# Patient Record
Sex: Female | Born: 1985
Health system: Southern US, Community
[De-identification: ages and names within clinical notes are randomized; demographics above are authoritative.]

## PROBLEM LIST (undated history)

## (undated) DIAGNOSIS — C439 Malignant melanoma of skin, unspecified: Secondary | ICD-10-CM

## (undated) DIAGNOSIS — N3010 Interstitial cystitis (chronic) without hematuria: Secondary | ICD-10-CM

## (undated) DIAGNOSIS — F419 Anxiety disorder, unspecified: Secondary | ICD-10-CM

## (undated) HISTORY — PX: TONGUE SURGERY: SHX810

---

## 2004-06-15 ENCOUNTER — Emergency Department (HOSPITAL_COMMUNITY): Admission: EM | Admit: 2004-06-15 | Discharge: 2004-06-15 | Payer: Self-pay | Admitting: Emergency Medicine

## 2005-03-24 ENCOUNTER — Ambulatory Visit (HOSPITAL_COMMUNITY): Admission: RE | Admit: 2005-03-24 | Discharge: 2005-03-24 | Payer: Self-pay | Admitting: Family Medicine

## 2006-01-29 ENCOUNTER — Observation Stay (HOSPITAL_COMMUNITY): Admission: AD | Admit: 2006-01-29 | Discharge: 2006-01-30 | Payer: Self-pay | Admitting: Obstetrics and Gynecology

## 2006-02-18 ENCOUNTER — Ambulatory Visit (HOSPITAL_COMMUNITY): Admission: AD | Admit: 2006-02-18 | Discharge: 2006-02-18 | Payer: Self-pay | Admitting: Obstetrics and Gynecology

## 2006-02-23 ENCOUNTER — Ambulatory Visit (HOSPITAL_COMMUNITY): Admission: AD | Admit: 2006-02-23 | Discharge: 2006-02-23 | Payer: Self-pay | Admitting: Obstetrics and Gynecology

## 2006-02-25 ENCOUNTER — Inpatient Hospital Stay (HOSPITAL_COMMUNITY): Admission: AD | Admit: 2006-02-25 | Discharge: 2006-02-28 | Payer: Self-pay | Admitting: Obstetrics and Gynecology

## 2007-09-23 ENCOUNTER — Ambulatory Visit (HOSPITAL_COMMUNITY): Admission: RE | Admit: 2007-09-23 | Discharge: 2007-09-23 | Payer: Self-pay | Admitting: Family Medicine

## 2007-12-25 ENCOUNTER — Emergency Department (HOSPITAL_COMMUNITY): Admission: EM | Admit: 2007-12-25 | Discharge: 2007-12-25 | Payer: Self-pay | Admitting: Emergency Medicine

## 2008-03-01 ENCOUNTER — Other Ambulatory Visit: Admission: RE | Admit: 2008-03-01 | Discharge: 2008-03-01 | Payer: Self-pay | Admitting: Obstetrics & Gynecology

## 2011-03-07 NOTE — Group Therapy Note (Signed)
NAMENASHALI, DITMER NO.:  [id number]   MEDICAL RECORD NO.:  [id number]          PATIENT TYPE:  INP   LOCATION:  A402                          FACILITY:  [facility]   PHYSICIAN:  Tilda Burrow, M.D. DATE OF BIRTH:  Feb 25, 1986   DATE OF PROCEDURE:  DATE OF DISCHARGE:                                   PROGRESS NOTE   DELIVERY NOTE  Cailin received an epidural and was extremely comfortable and progressed  nicely through labor.  I examined her just as a routine check and noticed  the baby to be at +3 to +4 station.  She had no sense of __pressure__ at  all.  We got her in a pushing position, and she pushed for about 15 minutes  and had a spontaneous vaginal delivery of a viable female infant at 1301  hours.  The mouth and nose were suctioned and the body delivered without any  difficulty at all.  Weight is 6 pounds 3 ounces.  Apgars are 9 and 9.  Pitocin 20 units diluted in 1000 mL of lactated Ringer's was then infused  rapidly IV.  The placenta separated spontaneously and delivered via  controlled cord traction at 1305 hours.  It was inspected and appears to be  intact with a three-vessel cord.  The fundus was immediately firm and barely  any blood loss was noted at all.  Estimated blood loss is 100 mL.  The  vagina was inspected, and a first-degree perineal laceration was noted.  I  did repair it with about three stitches of 2-0 Vicryl under epidural  anesthesia.  Epidural was then removed, and the blue tip was visualized as  being intact.      Jacklyn Shell, C.N.M.      Tilda Burrow, M.D.  Electronically Signed    FC/MEDQ  D:  02/26/2006  T:  02/27/2006  Job:  [id number]   cc:   Francoise Schaumann. Milford Cage DO, FAAP  Fax: [phone]

## 2011-03-07 NOTE — H&P (Signed)
Sheri Wyatt, Sheri Wyatt NO.:  [id number]   MEDICAL RECORD NO.:  [id number]          PATIENT TYPE:  OIB   LOCATION:  A415                          FACILITY:  [facility]   PHYSICIAN:  [name], M.D.   DATE OF BIRTH:  06/06/1986   DATE OF ADMISSION:  02/23/2006  DATE OF DISCHARGE:  LH                                HISTORY & PHYSICAL   Sheri Wyatt is a 25 year old white female, gravida 1, para 0.  Estimated date of  delivery Mar 10, 2006 at 38 weeks and a couple of days gestation, who has  pure gestational hypertension.  She has had a negative workup for  preeclampsia and continues to have negative protein but blood pressures  remained in the 140/100 to 130/100 range.  She has had reassuring fetal  heart rate tracings.  Her cervix is tight fingertip, soft, posterior vertex,  -2 station.  She is admitted for Foley bulb ripening followed by Pitocin  induction.   PAST MEDICAL HISTORY:  Significant for interstitial cystitis, seasonal  allergies, asthma, and HSV-II.   PAST SURGICAL HISTORY:  Wisdom teeth and filtrum clip.   ALLERGIES:  None.   MEDICATIONS:  Currently are Valtrex, prenatal vitamins, and allergy meds.   REVIEW OF SYSTEMS:  Otherwise negative.  Blood type is O-.  Rubella is  immune.  Group B strep is negative.  GC and Chlamydia are negative x2.  She  is on Valtrex suppression.   IMPRESSION:  1.  Intrauterine pregnancy at 38+ weeks gestation.  2.  Pregnancy induced hypertension, pure gestational hypertension.  No      evidence of preeclampsia.   PLAN:  Patient admitted for Foley bulb ripening and Pitocin induction.      [name], M.D.  Electronically Signed     LHE/MEDQ  D:  02/23/2006  T:  02/23/2006  Job:  [id number]

## 2011-03-07 NOTE — H&P (Signed)
NAMEKIAIRA, POINTER NO.:  [id number]   MEDICAL RECORD NO.:  [id number]           PATIENT TYPE:  OIB   LOCATION:  LDR3                          FACILITY:  [facility]   PHYSICIAN:  [name], M.D.   DATE OF BIRTH:  Mar 12, 1986   DATE OF ADMISSION:  DATE OF DISCHARGE:  LH                                HISTORY & PHYSICAL   Patient is going to be observation in the birthing center.   CHIEF COMPLAINT:  Elevated blood pressures at [redacted] weeks gestation.   Skilar came into see [place] on Tuesday for a regular prenatal visit, where her  diastolic was noted to be elevated at 90, where typically she had been in  the 60s to 80 range.  She did not have proteinuria.  DTRs were 4+ at that  time, and she did have some edema.  The edema is nothing new.  She was asked  to come back in two days for re-evaluation.   Her blood pressure today was 160/100.  Still no proteinuria.  Still 2-3+  edema.  Still 4+ DTRs without clonus.  She does complain of a headache  without visual disturbances.   We are going to send her over to labor and delivery to run some preeclampsia  labs, 24-hour urine, and get some serial blood pressures on her so we can  see if this is going to become a problem or not.      Jacklyn Shell, C.N.M.      [name], M.D.  Electronically Signed    FC/MEDQ  D:  01/29/2006  T:  01/29/2006  Job:  [id number]

## 2011-03-07 NOTE — Op Note (Signed)
NAMEJOANA, NOLTON NO.:  [id number]   MEDICAL RECORD NO.:  [id number]          PATIENT TYPE:  INP   LOCATION:  A402                          FACILITY:  [facility]   PHYSICIAN:  [name], M.D.   DATE OF BIRTH:  04-20-1986   DATE OF PROCEDURE:  02/26/2006  DATE OF DISCHARGE:  02/28/2006                                 OPERATIVE REPORT   PROCEDURE:  Epidural.   Sheri Wyatt is a 25 year old, nulliparous female, in active phase of labor,  requesting epidural be placed.  She is placed in sitting position.  Betadine  prep was used and 1% lidocaine was injected into the L3-L4 interspace areas.  The 17 gauge Tuohy needle was used, loss of resistance technique employed.  The epidural space was found on one pass without difficulty and 10 cc of  0.125% bupivacaine plain is given into the epidural space without  difficulty.  The epidural catheter is then fed 5 cm into the space.  An  additional 10 cc is given to dose up the epidural.  The continuous infusion  of 0.125% bupivacaine with 2 micrograms per cc of fentanyl was then begun at  12 cc an hour.  It was taped down.  The patient tolerated the procedure  well.  She is having no drop in blood pressure.  Fetal heart rate tracing is  stable and she is getting good pain relief.      [name], M.D.  Electronically Signed     LHE/MEDQ  D:  04/20/2006  T:  04/20/2006  Job:  [id number]

## 2011-07-14 LAB — BASIC METABOLIC PANEL
BUN: 15
CO2: 24
Chloride: 107
Creatinine, Ser: 0.83
Glucose, Bld: 127 — ABNORMAL HIGH
Sodium: 139

## 2011-07-14 LAB — URINALYSIS, ROUTINE W REFLEX MICROSCOPIC
Glucose, UA: NEGATIVE
Nitrite: NEGATIVE
Protein, ur: NEGATIVE
Urobilinogen, UA: 0.2
pH: 5.5

## 2011-07-14 LAB — CBC: MCHC: 34.3

## 2011-07-14 LAB — DIFFERENTIAL
Basophils Relative: 0
Eosinophils Absolute: 0.1
Eosinophils Relative: 0
Lymphocytes Relative: 5 — ABNORMAL LOW
Monocytes Absolute: 0.6
Neutro Abs: 15.5 — ABNORMAL HIGH

## 2011-07-14 LAB — URINE MICROSCOPIC-ADD ON

## 2011-12-29 ENCOUNTER — Emergency Department (HOSPITAL_COMMUNITY)
Admission: EM | Admit: 2011-12-29 | Discharge: 2011-12-29 | Disposition: A | Discharge status: Home / Self Care | Payer: PRIVATE HEALTH INSURANCE | Attending: Emergency Medicine | Admitting: Emergency Medicine

## 2011-12-29 ENCOUNTER — Emergency Department (HOSPITAL_COMMUNITY): Payer: PRIVATE HEALTH INSURANCE

## 2011-12-29 ENCOUNTER — Encounter (HOSPITAL_COMMUNITY): Payer: Self-pay | Admitting: *Deleted

## 2011-12-29 DIAGNOSIS — M549 Dorsalgia, unspecified: Secondary | ICD-10-CM | POA: Insufficient documentation

## 2011-12-29 DIAGNOSIS — R1084 Generalized abdominal pain: Secondary | ICD-10-CM | POA: Insufficient documentation

## 2011-12-29 DIAGNOSIS — R630 Anorexia: Secondary | ICD-10-CM | POA: Insufficient documentation

## 2011-12-29 DIAGNOSIS — K5900 Constipation, unspecified: Secondary | ICD-10-CM | POA: Insufficient documentation

## 2011-12-29 DIAGNOSIS — R319 Hematuria, unspecified: Secondary | ICD-10-CM | POA: Insufficient documentation

## 2011-12-29 DIAGNOSIS — R109 Unspecified abdominal pain: Secondary | ICD-10-CM

## 2011-12-29 DIAGNOSIS — J45909 Unspecified asthma, uncomplicated: Secondary | ICD-10-CM | POA: Insufficient documentation

## 2011-12-29 DIAGNOSIS — R10817 Generalized abdominal tenderness: Secondary | ICD-10-CM | POA: Insufficient documentation

## 2011-12-29 DIAGNOSIS — R509 Fever, unspecified: Secondary | ICD-10-CM | POA: Insufficient documentation

## 2011-12-29 DIAGNOSIS — R300 Dysuria: Secondary | ICD-10-CM | POA: Insufficient documentation

## 2011-12-29 HISTORY — DX: Interstitial cystitis (chronic) without hematuria: N30.10

## 2011-12-29 HISTORY — DX: Malignant melanoma of skin, unspecified: C43.9

## 2011-12-29 LAB — BASIC METABOLIC PANEL
Calcium: 9.2 mg/dL (ref 8.4–10.5)
Chloride: 101 mEq/L (ref 96–112)
GFR calc Af Amer: >90 mL/min (ref >90)
GFR calc non Af Amer: >90 mL/min (ref >90)
Glucose, Bld: 97 mg/dL (ref 70–99)

## 2011-12-29 LAB — DIFFERENTIAL
Lymphocytes Relative: 9 % — ABNORMAL LOW (ref 12–46)
Monocytes Absolute: 0.3 10*3/uL (ref 0.1–1.0)

## 2011-12-29 LAB — CBC
Hemoglobin: 13.9 g/dL (ref 12.0–15.0)
MCH: 29.1 pg (ref 26.0–34.0)
MCHC: 35.1 g/dL (ref 30.0–36.0)
MCV: 82.8 fL (ref 78.0–100.0)
RBC: 4.78 MIL/uL (ref 3.87–5.11)
RDW: 12.2 % (ref 11.5–15.5)

## 2011-12-29 LAB — POCT PREGNANCY, URINE: Preg Test, Ur: NEGATIVE

## 2011-12-29 MED ORDER — HYDROMORPHONE HCL PF 1 MG/ML IJ SOLN
1.0000 mg | Freq: Once | INTRAMUSCULAR | Status: AC
  Administered 2011-12-29: 1 mg via INTRAVENOUS
  Filled 2011-12-29: qty 1

## 2011-12-29 MED ORDER — KETOROLAC TROMETHAMINE 30 MG/ML IJ SOLN
30.0000 mg | Freq: Once | INTRAMUSCULAR | Status: DC
  Filled 2011-12-29: qty 1

## 2011-12-29 MED ORDER — KETOROLAC TROMETHAMINE 30 MG/ML IJ SOLN
30.0000 mg | Freq: Once | INTRAMUSCULAR | Status: AC
  Administered 2011-12-29: 30 mg via INTRAMUSCULAR

## 2011-12-29 MED ORDER — FENTANYL CITRATE 0.05 MG/ML IJ SOLN
100.0000 ug | Freq: Once | INTRAMUSCULAR | Status: DC
  Filled 2011-12-29: qty 2

## 2011-12-29 MED ORDER — POLYETHYLENE GLYCOL 3350 17 GM/SCOOP PO POWD: 17.0000 g | Freq: Two times a day (BID) | ORAL | Status: AC

## 2011-12-29 MED ORDER — ONDANSETRON HCL 4 MG/2ML IJ SOLN
4.0000 mg | Freq: Once | INTRAMUSCULAR | Status: DC
  Filled 2011-12-29: qty 2

## 2011-12-29 MED ORDER — FENTANYL CITRATE 0.05 MG/ML IJ SOLN
100.0000 ug | Freq: Once | INTRAMUSCULAR | Status: AC
  Administered 2011-12-29: 100 ug via INTRAVENOUS
  Filled 2011-12-29: qty 2

## 2011-12-29 MED ORDER — ONDANSETRON 4 MG PO TBDP
4.0000 mg | ORAL_TABLET | Freq: Once | ORAL | Status: AC
  Administered 2011-12-29: 4 mg via ORAL
  Filled 2011-12-29: qty 1

## 2011-12-29 MED ORDER — OXYCODONE-ACETAMINOPHEN 5-325 MG PO TABS: 1.0000 | ORAL_TABLET | ORAL | Status: AC | PRN

## 2011-12-29 MED ORDER — FENTANYL CITRATE 0.05 MG/ML IJ SOLN
100.0000 ug | Freq: Once | INTRAMUSCULAR | Status: AC
  Administered 2011-12-29: 100 ug via INTRAMUSCULAR
  Filled 2011-12-29: qty 2

## 2011-12-29 NOTE — ED Notes (Signed)
abd and back pain, nausea, without vomiting, no diarrhea.

## 2011-12-29 NOTE — ED Provider Notes (Signed)
History   This chart was scribed for Nelia Shi, MD by Sofie Rower. The patient was seen in room APA07/APA07 and the patient's care was started at 3:15PM.    CSN: [id number]  Arrival date & time 12/29/11  1353   First MD Initiated Contact with Patient 12/29/11 1512      Chief Complaint  Patient presents with  . Abdominal Pain    (Consider location/radiation/quality/duration/timing/severity/associated sxs/prior treatment) HPI  Sheri Wyatt is a 26 y.o. female who presents to the Emergency Department complaining of severe, constant abdominal pain generalized throughout all four quadrants, onset two days ago with associated symptoms of dysuria, hematuria, fever (100), nausea, loss of appetite, decreased bowel movements. Pt states "it hurts to urinate." Pt also states she "has back pain all across the bottom of her back." Pt describes the pain as a "sharp pain". Pt states "she does not think she is pregnant but there is always a chance." Pt has a hx of interstitial cystitis, allergies to certain foods and cats.   Pt denies abd pain in the past, vomiting.   PCP is Dr. Regino Schultze.  Past Medical History  Diagnosis Date  . Interstitial cystitis   . Asthma   . Melanoma     History reviewed. No pertinent past surgical history.  History reviewed. No pertinent family history.  History  Substance Use Topics  . Smoking status: Never Smoker   . Smokeless tobacco: Not on file  . Alcohol Use: No    OB History    Grav Para Term Preterm Abortions TAB SAB Ect Mult Living                  Review of Systems  All other systems reviewed and are negative.    10 Systems reviewed and are negative for acute change except as noted in the HPI.   Allergies  Review of patient's allergies indicates no known allergies.  Home Medications   Current Outpatient Rx  Name Route Sig Dispense Refill  . OXYCODONE-ACETAMINOPHEN 5-325 MG PO TABS Oral Take 1 tablet by mouth every 4 (four) hours  as needed for pain. 6 tablet 0  . POLYETHYLENE GLYCOL 3350 PO POWD Oral Take 17 g by mouth 2 (two) times daily. 255 g 0    BP 101/86  Pulse 81  Temp(Src) 97.4 F (36.3 C) (Oral)  Resp 18  Ht 5\' 3"  (1.6 m)  Wt 130 lb (58.968 kg)  BMI 23.03 kg/m2  SpO2 100%  LMP 12/27/2011  Physical Exam  Nursing note and vitals reviewed. Constitutional: She is oriented to person, place, and time. She appears well-developed and well-nourished. No distress.  HENT:  Head: Normocephalic and atraumatic.  Right Ear: External ear normal.  Left Ear: External ear normal.  Nose: Nose normal.  Eyes: EOM are normal. Pupils are equal, round, and reactive to light.  Neck: Normal range of motion.  Cardiovascular: Normal rate, regular rhythm, normal heart sounds and intact distal pulses.   Pulmonary/Chest: Effort normal and breath sounds normal. No respiratory distress.  Abdominal: Normal appearance and bowel sounds are normal. She exhibits no distension. There is tenderness (Generalized in all four quadrants.). There is no guarding.  Musculoskeletal: Normal range of motion. She exhibits no edema.  Neurological: She is alert and oriented to person, place, and time. No cranial nerve deficit.  Skin: Skin is warm and dry. No rash noted.  Psychiatric: She has a normal mood and affect. Her behavior is normal.    ED  Course  Procedures (including critical care time)  DIAGNOSTIC STUDIES: Oxygen Saturation is 100% on room air, normal by my interpretation.    COORDINATION OF CARE:     Labs Reviewed  DIFFERENTIAL - Abnormal; Notable for the following:    Neutrophils Relative 85 (*)    Lymphocytes Relative 9 (*)    Lymphs Abs 0.5 (*)    All other components within normal limits  BASIC METABOLIC PANEL - Abnormal; Notable for the following:    Sodium 134 (*)    All other components within normal limits  CBC  POCT PREGNANCY, URINE   Ct Abdomen Pelvis Wo Contrast  12/29/2011  *RADIOLOGY REPORT*  Clinical  Data: Generalized abdominal pain  CT ABDOMEN AND PELVIS WITHOUT CONTRAST  Technique:  Multidetector CT imaging of the abdomen and pelvis was performed following the standard protocol without intravenous contrast.  Comparison: None.  Findings: Clear lung bases.  Normal heart size.  No pericardial or pleural effusion.  Abdomen:  Symmetric kidneys without hydronephrosis or obstruction. No obstructing urinary tract calculus or ureteral calculus demonstrated.  Kidneys are symmetric in size and position.  Liver, gallbladder, biliary system, pancreas, spleen, and adrenal glands are within normal limits for noncontrast study.  Diffuse fluid distention of the distal small bowel and colon. Scattered air fluid levels noted.  No definite obstruction pattern noted.  Retained stool in the sigmoid and rectum.  Appearance suggests some degree of distal fecal impaction.  Hyperdense ingested material or pill fragments noted in the fluid distended cecum, image 63.  Portions of the appendix are demonstrated and unremarkable, image 49.  No acute osseous finding  Pelvis:  No distal pelvic dilated ureter.  Pelvic calcifications appear to be outside the urinary tract consistent with venous phleboliths.  No pelvic free fluid, fluid collection, hemorrhage, hematoma, adenopathy, inguinal abnormality, or hernia.  IMPRESSION: Fluid distended distal small bowel and colon with scattered air fluid levels.  Retained stool in the sigmoid and rectum compatible with some degree of fecal impaction.  No definite obstruction or free air.  No acute obstructing urinary tract calculus.  Negative for acute appendicitis  Original Report Authenticated By: Judie Petit. Ruel Favors, M.D.    Ct Abdomen Pelvis Wo Contrast  12/29/2011  *RADIOLOGY REPORT*  Clinical Data: Generalized abdominal pain  CT ABDOMEN AND PELVIS WITHOUT CONTRAST  Technique:  Multidetector CT imaging of the abdomen and pelvis was performed following the standard protocol without intravenous  contrast.  Comparison: None.  Findings: Clear lung bases.  Normal heart size.  No pericardial or pleural effusion.  Abdomen:  Symmetric kidneys without hydronephrosis or obstruction. No obstructing urinary tract calculus or ureteral calculus demonstrated.  Kidneys are symmetric in size and position.  Liver, gallbladder, biliary system, pancreas, spleen, and adrenal glands are within normal limits for noncontrast study.  Diffuse fluid distention of the distal small bowel and colon. Scattered air fluid levels noted.  No definite obstruction pattern noted.  Retained stool in the sigmoid and rectum.  Appearance suggests some degree of distal fecal impaction.  Hyperdense ingested material or pill fragments noted in the fluid distended cecum, image 63.  Portions of the appendix are demonstrated and unremarkable, image 49.  No acute osseous finding  Pelvis:  No distal pelvic dilated ureter.  Pelvic calcifications appear to be outside the urinary tract consistent with venous phleboliths.  No pelvic free fluid, fluid collection, hemorrhage, hematoma, adenopathy, inguinal abnormality, or hernia.  IMPRESSION: Fluid distended distal small bowel and colon with scattered air fluid levels.  Retained stool in the sigmoid and rectum compatible with some degree of fecal impaction.  No definite obstruction or free air.  No acute obstructing urinary tract calculus.  Negative for acute appendicitis  Original Report Authenticated By: Judie Petit. Ruel Favors, M.D.      1. Abdominal  pain, other specified site   2. Constipation     3:20PM-EDP at bedside discusses treatment plan concerning pain management, Cat scan.   MDM   Scheduled Meds:   . fentaNYL  100 mcg Intramuscular Once  . fentaNYL  100 mcg Intravenous Once  .  HYDROmorphone (DILAUDID) injection  1 mg Intravenous Once  . ketorolac  30 mg Intramuscular Once  . ondansetron  4 mg Oral Once  . DISCONTD: fentaNYL  100 mcg Intravenous Once  . DISCONTD: ketorolac  30 mg  Intravenous Once  . DISCONTD: ondansetron  4 mg Intravenous Once   Continuous Infusions:  PRN Meds:.      I personally performed the services described in this documentation, which was scribed in my presence. The recorded information has been reviewed and considered.    Nelia Shi, MD 12/29/11 [phone]

## 2011-12-29 NOTE — ED Notes (Signed)
Reports pain has gotten worse since receiving IM meds.  edp notified.

## 2011-12-29 NOTE — ED Notes (Signed)
Attempted IV  X 2 without success.  Pt requesting meds be IM or PO.  edp notified.

## 2011-12-29 NOTE — Discharge Instructions (Signed)
Abdominal Pain  Abdominal pain can be caused by many things. Your caregiver decides the seriousness of your pain by an examination and possibly blood tests and X-rays. Many cases can be observed and treated at home. Most abdominal pain is not caused by a disease and will probably improve without treatment. However, in many cases, more time must pass before a clear cause of the pain can be found. Before that point, it may not be known if you need more testing, or if hospitalization or surgery is needed.  HOME CARE INSTRUCTIONS    Do not take laxatives unless directed by your caregiver.   Take pain medicine only as directed by your caregiver.   Only take over-the-counter or prescription medicines for pain, discomfort, or fever as directed by your caregiver.   Try a clear liquid diet (broth, tea, or water) for as long as directed by your caregiver. Slowly move to a bland diet as tolerated.  SEEK IMMEDIATE MEDICAL CARE IF:    The pain does not go away.   You have a fever.   You keep throwing up (vomiting).   The pain is felt only in portions of the abdomen. Pain in the right side could possibly be appendicitis. In an adult, pain in the left lower portion of the abdomen could be colitis or diverticulitis.   You pass bloody or black tarry stools.  MAKE SURE YOU:    Understand these instructions.   Will watch your condition.   Will get help right away if you are not doing well or get worse.  Document Released: 07/16/2005 Document Revised: 09/25/2011 Document Reviewed: 05/24/2008  [employer] Patient Information 2012 [employer]  Constipation in Adults  Constipation is having fewer than 2 bowel movements per week. Usually, the stools are hard. As we grow older, constipation is more common. If you try to fix constipation with laxatives, the problem may get worse. This is because laxatives taken over a long period of time make the colon muscles weaker. A low-fiber diet, not taking in enough fluids, and taking  some medicines may make these problems worse.  MEDICATIONS THAT MAY CAUSE CONSTIPATION   Water pills (diuretics).   Calcium channel blockers (used to control blood pressure and for the heart).   Certain pain medicines (narcotics).   Anticholinergics.   Anti-inflammatory agents.   Antacids that contain aluminum.  DISEASES THAT CONTRIBUTE TO CONSTIPATION   Diabetes.   Parkinson's disease.   Dementia.   Stroke.   Depression.   Illnesses that cause problems with salt and water metabolism.  HOME CARE INSTRUCTIONS    Constipation is usually best cared for without medicines. Increasing dietary fiber and eating more fruits and vegetables is the best way to manage constipation.   Slowly increase fiber intake to 25 to 38 grams per day. Whole grains, fruits, vegetables, and legumes are good sources of fiber. A dietitian can further help you incorporate high-fiber foods into your diet.   Drink enough water and fluids to keep your urine clear or pale yellow.   A fiber supplement may be added to your diet if you cannot get enough fiber from foods.   Increasing your activities also helps improve regularity.   Suppositories, as suggested by your caregiver, will also help. If you are using antacids, such as aluminum or calcium containing products, it will be helpful to switch to products containing magnesium if your caregiver says it is okay.   If you have been given a liquid injection (enema) today,   this is only a temporary measure. It should not be relied on for treatment of longstanding (chronic) constipation.   Stronger measures, such as magnesium sulfate, should be avoided if possible. This may cause uncontrollable diarrhea. Using magnesium sulfate may not allow you time to make it to the bathroom.  SEEK IMMEDIATE MEDICAL CARE IF:    There is bright red blood in the stool.   The constipation stays for more than 4 days.   There is belly (abdominal) or rectal pain.   You do not seem to be getting  better.   You have any questions or concerns.  MAKE SURE YOU:    Understand these instructions.   Will watch your condition.   Will get help right away if you are not doing well or get worse.  Document Released: 07/04/2004 Document Revised: 09/25/2011 Document Reviewed: 09/09/2011  [employer] Patient Information 2012 [employer]

## 2011-12-29 NOTE — ED Notes (Signed)
edp notified of pt's bp and states is okay to d/c pt.

## 2012-01-21 ENCOUNTER — Other Ambulatory Visit (HOSPITAL_COMMUNITY)
Admission: RE | Admit: 2012-01-21 | Discharge: 2012-01-21 | Disposition: A | Discharge status: Home / Self Care | Payer: PRIVATE HEALTH INSURANCE | Source: Ambulatory Visit | Attending: Obstetrics & Gynecology | Admitting: Obstetrics & Gynecology

## 2012-01-21 ENCOUNTER — Other Ambulatory Visit: Payer: Self-pay | Admitting: Obstetrics & Gynecology

## 2012-01-21 DIAGNOSIS — Z01419 Encounter for gynecological examination (general) (routine) without abnormal findings: Secondary | ICD-10-CM | POA: Insufficient documentation

## 2012-03-28 ENCOUNTER — Other Ambulatory Visit: Payer: Self-pay | Admitting: Obstetrics & Gynecology

## 2012-03-28 MED ORDER — KETOROLAC TROMETHAMINE 30 MG/ML IJ SOLN: 30.0000 mg | Freq: Once | INTRAMUSCULAR | Status: DC

## 2012-04-07 ENCOUNTER — Other Ambulatory Visit (HOSPITAL_COMMUNITY): Payer: PRIVATE HEALTH INSURANCE

## 2012-04-14 ENCOUNTER — Ambulatory Visit: Admit: 2012-04-14 | Payer: Self-pay | Admitting: Obstetrics & Gynecology

## 2012-04-14 SURGERY — HYSTERECTOMY, SUPRACERVICAL, LAPAROSCOPIC
Anesthesia: General | Site: Vagina

## 2012-04-15 ENCOUNTER — Encounter (HOSPITAL_COMMUNITY): Payer: Self-pay | Admitting: Pharmacy Technician

## 2012-04-21 ENCOUNTER — Encounter (HOSPITAL_COMMUNITY)
Admission: RE | Admit: 2012-04-21 | Discharge: 2012-04-21 | Disposition: A | Discharge status: Home / Self Care | Payer: PRIVATE HEALTH INSURANCE | Source: Ambulatory Visit | Attending: Obstetrics & Gynecology | Admitting: Obstetrics & Gynecology

## 2012-04-21 ENCOUNTER — Encounter (HOSPITAL_COMMUNITY): Payer: Self-pay

## 2012-04-21 HISTORY — DX: Anxiety disorder, unspecified: F41.9

## 2012-04-21 LAB — COMPREHENSIVE METABOLIC PANEL WITH GFR
ALT: 10 U/L (ref 0–35)
AST: 15 U/L (ref 0–37)
Albumin: 4 g/dL (ref 3.5–5.2)
Alkaline Phosphatase: 50 U/L (ref 39–117)
BUN: 14 mg/dL (ref 6–23)
CO2: 25 meq/L (ref 19–32)
Calcium: 9.7 mg/dL (ref 8.4–10.5)
Chloride: 102 meq/L (ref 96–112)
Creatinine, Ser: 0.74 mg/dL (ref 0.50–1.10)
GFR calc Af Amer: >90 mL/min
GFR calc non Af Amer: >90 mL/min
Glucose, Bld: 72 mg/dL (ref 70–99)
Potassium: 4 meq/L (ref 3.5–5.1)
Sodium: 138 meq/L (ref 135–145)
Total Bilirubin: 0.3 mg/dL (ref 0.3–1.2)
Total Protein: 7.1 g/dL (ref 6.0–8.3)

## 2012-04-21 LAB — CBC
MCH: 28.8 pg (ref 26.0–34.0)
MCV: 84.7 fL (ref 78.0–100.0)
Platelets: 192 10*3/uL (ref 150–400)
RBC: 4.65 MIL/uL (ref 3.87–5.11)

## 2012-04-21 LAB — HCG, QUANTITATIVE, PREGNANCY: hCG, Beta Chain, Quant, S: <1 m[IU]/mL (ref <5)

## 2012-04-21 LAB — SURGICAL PCR SCREEN: MRSA, PCR: NEGATIVE

## 2012-04-21 NOTE — Patient Instructions (Addendum)
20 Sheri Wyatt [place]  04/21/2012   Your procedure is scheduled on:  04/28/2012  Report to [facility] at  700 AM.  Call this number if you have problems the morning of surgery: [phone]   Remember:   Do not eat food:After Midnight.  May have clear liquids:until Midnight .    Take these medicines the morning of surgery with A SIP OF WATER: none   Do not wear jewelry, make-up or nail polish.  Do not wear lotions, powders, or perfumes. You may wear deodorant.  Do not shave 48 hours prior to surgery. Men may shave face and neck.  Do not bring valuables to the hospital.  Contacts, dentures or bridgework may not be worn into surgery.  Leave suitcase in the car. After surgery it may be brought to your room.  For patients admitted to the hospital, checkout time is 11:00 AM the day of discharge.   Patients discharged the day of surgery will not be allowed to drive home.  Name and phone number of your driver: family  Special Instructions: CHG Shower Use Special Wash: 1/2 bottle night before surgery and 1/2 bottle morning of surgery.   Please read over the following fact sheets that you were given: Pain Booklet, MRSA Information, Surgical Site Infection Prevention, Anesthesia Post-op Instructions and Care and Recovery After Surgery Hysterectomy Information  A hysterectomy is a procedure where your uterus is surgically removed. It will no longer be possible to have menstrual periods or to become pregnant. The tubes and ovaries can be removed (bilateral salpingo-oopherectomy) during this surgery as well.  REASONS FOR A HYSTERECTOMY  Persistent, abnormal bleeding.   Lasting (chronic) pelvic pain or infection.   The lining of the uterus (endometrium) starts growing outside the uterus (endometriosis).   The endometrium starts growing in the muscle of the uterus (adenomyosis).   The uterus falls down into the vagina (pelvic organ prolapse).   Symptomatic uterine fibroids.   Precancerous  cells.   Cervical cancer or uterine cancer.  TYPES OF HYSTERECTOMIES  Supracervical hysterectomy. This type removes the top part of the uterus, but not the cervix.   Total hysterectomy. This type removes the uterus and cervix.   Radical hysterectomy. This type removes the uterus, cervix, and the fibrous tissue that holds the uterus in place in the pelvis (parametrium).  WAYS A HYSTERECTOMY CAN BE PERFORMED  Abdominal hysterectomy. A large surgical cut (incision) is made in the abdomen. The uterus is removed through this incision.   Vaginal hysterectomy. An incision is made in the vagina. The uterus is removed through this incision. There are no abdominal incisions.   Conventional laparoscopic hysterectomy. A thin, lighted tube with a camera (laparoscope) is inserted into 3 or 4 small incisions in the abdomen. The uterus is cut into small pieces. The small pieces are removed through the incisions, or they are removed through the vagina.   Laparoscopic assisted vaginal hysterectomy (LAVH). Three or four small incisions are made in the abdomen. Part of the surgery is performed laparoscopically and part vaginally. The uterus is removed through the vagina.   Robot-assisted laparoscopic hysterectomy. A laparoscope is inserted into 3 or 4 small incisions in the abdomen. A computer-controlled device is used to give the surgeon a 3D image. This allows for more precise movements of surgical instruments. The uterus is cut into small pieces and removed through the incisions or removed through the vagina.  RISKS OF HYSTERECTOMY   Bleeding and risk of blood  transfusion. Tell your caregiver if you do not want to receive any blood products.   Blood clots in the legs or lung.   Infection.   Injury to surrounding organs.   Anesthesia problems or side effects.   Conversion to an abdominal hysterectomy.  WHAT TO EXPECT AFTER A HYSTERECTOMY  You will be given pain medicine.   You will need to have  someone with you for the first 3 to 5 days after you go home.   You will need to follow up with your surgeon in 2 to 4 weeks after surgery to evaluate your progress.   You may have early menopause symptoms like hot flashes, night sweats, and insomnia.   If you had a hysterectomy for a problem that was not a cancer or a condition that could lead to cancer, then you no longer need Pap tests. However, even if you no longer need a Pap test, a regular exam is a good idea to make sure no other problems are starting.  Document Released: 04/01/2001 Document Revised: 09/25/2011 Document Reviewed: 05/17/2011 [facility] Patient Information 2012 [place], [place].PATIENT INSTRUCTIONS POST-ANESTHESIA  IMMEDIATELY FOLLOWING SURGERY:  Do not drive or operate machinery for the first twenty four hours after surgery.  Do not make any important decisions for twenty four hours after surgery or while taking narcotic pain medications or sedatives.  If you develop intractable nausea and vomiting or a severe headache please notify your doctor immediately.  FOLLOW-UP:  Please make an appointment with your surgeon as instructed. You do not need to follow up with anesthesia unless specifically instructed to do so.  WOUND CARE INSTRUCTIONS (if applicable):  Keep a dry clean dressing on the anesthesia/puncture wound site if there is drainage.  Once the wound has quit draining you may leave it open to air.  Generally you should leave the bandage intact for twenty four hours unless there is drainage.  If the epidural site drains for more than 36-48 hours please call the anesthesia department.  QUESTIONS?:  Please feel free to call your physician or the hospital operator if you have any questions, and they will be happy to assist you.

## 2012-04-24 LAB — TYPE AND SCREEN
ABO/RH(D): O NEG
Antibody Screen: NEGATIVE

## 2012-04-28 ENCOUNTER — Ambulatory Visit (HOSPITAL_COMMUNITY)
Admission: RE | Admit: 2012-04-28 | Discharge: 2012-04-29 | Disposition: A | Discharge status: Home / Self Care | Payer: PRIVATE HEALTH INSURANCE | Source: Ambulatory Visit | Attending: Obstetrics & Gynecology | Admitting: Obstetrics & Gynecology

## 2012-04-28 ENCOUNTER — Ambulatory Visit (HOSPITAL_COMMUNITY): Payer: PRIVATE HEALTH INSURANCE | Admitting: Anesthesiology

## 2012-04-28 ENCOUNTER — Encounter (HOSPITAL_COMMUNITY)
Admission: RE | Disposition: A | Discharge status: Home / Self Care | Payer: Self-pay | Source: Ambulatory Visit | Attending: Obstetrics & Gynecology

## 2012-04-28 ENCOUNTER — Encounter (HOSPITAL_COMMUNITY): Payer: Self-pay | Admitting: Anesthesiology

## 2012-04-28 ENCOUNTER — Encounter (HOSPITAL_COMMUNITY): Payer: Self-pay | Admitting: *Deleted

## 2012-04-28 DIAGNOSIS — Z4689 Encounter for fitting and adjustment of other specified devices: Secondary | ICD-10-CM | POA: Insufficient documentation

## 2012-04-28 DIAGNOSIS — N814 Uterovaginal prolapse, unspecified: Secondary | ICD-10-CM | POA: Insufficient documentation

## 2012-04-28 DIAGNOSIS — IMO0002 Reserved for concepts with insufficient information to code with codable children: Secondary | ICD-10-CM | POA: Insufficient documentation

## 2012-04-28 DIAGNOSIS — Z3046 Encounter for surveillance of implantable subdermal contraceptive: Secondary | ICD-10-CM | POA: Insufficient documentation

## 2012-04-28 DIAGNOSIS — Z01812 Encounter for preprocedural laboratory examination: Secondary | ICD-10-CM | POA: Insufficient documentation

## 2012-04-28 DIAGNOSIS — N920 Excessive and frequent menstruation with regular cycle: Secondary | ICD-10-CM | POA: Insufficient documentation

## 2012-04-28 DIAGNOSIS — N946 Dysmenorrhea, unspecified: Secondary | ICD-10-CM | POA: Insufficient documentation

## 2012-04-28 DIAGNOSIS — Z90710 Acquired absence of both cervix and uterus: Secondary | ICD-10-CM

## 2012-04-28 HISTORY — PX: NORPLANT REMOVAL: SHX5385

## 2012-04-28 HISTORY — PX: VAGINAL HYSTERECTOMY: SHX2639

## 2012-04-28 SURGERY — HYSTERECTOMY, VAGINAL
Anesthesia: General | Site: Vagina | Wound class: Clean Contaminated

## 2012-04-28 MED ORDER — ROCURONIUM BROMIDE 50 MG/5ML IV SOLN
INTRAVENOUS | Status: AC
  Filled 2012-04-28: qty 1

## 2012-04-28 MED ORDER — FENTANYL CITRATE 0.05 MG/ML IJ SOLN
INTRAMUSCULAR | Status: DC | PRN
  Administered 2012-04-28 (×4): 50 ug via INTRAVENOUS

## 2012-04-28 MED ORDER — NEOSTIGMINE METHYLSULFATE 1 MG/ML IJ SOLN
INTRAMUSCULAR | Status: DC | PRN
  Administered 2012-04-28: 3 mg via INTRAVENOUS

## 2012-04-28 MED ORDER — MIDAZOLAM HCL 2 MG/2ML IJ SOLN
INTRAMUSCULAR | Status: AC
  Filled 2012-04-28: qty 2

## 2012-04-28 MED ORDER — BUPIVACAINE-EPINEPHRINE PF 0.5-1:200000 % IJ SOLN
INTRAMUSCULAR | Status: AC
  Filled 2012-04-28: qty 10

## 2012-04-28 MED ORDER — KETOROLAC TROMETHAMINE 30 MG/ML IJ SOLN
INTRAMUSCULAR | Status: AC
  Filled 2012-04-28: qty 1

## 2012-04-28 MED ORDER — 0.9 % SODIUM CHLORIDE (POUR BTL) OPTIME
TOPICAL | Status: DC | PRN
  Administered 2012-04-28: 1000 mL

## 2012-04-28 MED ORDER — OXYCODONE-ACETAMINOPHEN 5-325 MG PO TABS
1.0000 | ORAL_TABLET | ORAL | Status: DC | PRN
  Administered 2012-04-28 – 2012-04-29 (×4): 2 via ORAL
  Filled 2012-04-28 (×4): qty 2

## 2012-04-28 MED ORDER — KETOROLAC TROMETHAMINE 30 MG/ML IJ SOLN
30.0000 mg | Freq: Three times a day (TID) | INTRAMUSCULAR | Status: DC | PRN
  Administered 2012-04-28: 30 mg via INTRAVENOUS

## 2012-04-28 MED ORDER — LIDOCAINE HCL 1 % IJ SOLN
INTRAMUSCULAR | Status: DC | PRN
  Administered 2012-04-28: 30 mg via INTRADERMAL

## 2012-04-28 MED ORDER — SODIUM CHLORIDE 0.9 % IR SOLN
Status: DC | PRN
  Administered 2012-04-28: 3000 mL

## 2012-04-28 MED ORDER — FENTANYL CITRATE 0.05 MG/ML IJ SOLN
INTRAMUSCULAR | Status: AC
  Filled 2012-04-28: qty 5

## 2012-04-28 MED ORDER — PROPOFOL 10 MG/ML IV EMUL
INTRAVENOUS | Status: AC
  Filled 2012-04-28: qty 20

## 2012-04-28 MED ORDER — PROPOFOL 10 MG/ML IV BOLUS
INTRAVENOUS | Status: DC | PRN
  Administered 2012-04-28: 130 mg via INTRAVENOUS

## 2012-04-28 MED ORDER — SODIUM CHLORIDE 0.9 % IV SOLN
8.0000 mg | Freq: Four times a day (QID) | INTRAVENOUS | Status: DC | PRN
  Filled 2012-04-28: qty 4

## 2012-04-28 MED ORDER — HYDROMORPHONE HCL PF 1 MG/ML IJ SOLN
INTRAMUSCULAR | Status: AC
  Filled 2012-04-28: qty 1

## 2012-04-28 MED ORDER — ONDANSETRON HCL 8 MG PO TABS
8.0000 mg | ORAL_TABLET | Freq: Four times a day (QID) | ORAL | Status: DC | PRN
  Administered 2012-04-29: 8 mg via ORAL
  Filled 2012-04-28: qty 2
  Filled 2012-04-28: qty 1

## 2012-04-28 MED ORDER — ZOLPIDEM TARTRATE 5 MG PO TABS: 5.0000 mg | ORAL_TABLET | Freq: Every evening | ORAL | Status: DC | PRN

## 2012-04-28 MED ORDER — BUPROPION HCL ER (SR) 150 MG PO TB12
ORAL_TABLET | ORAL | Status: AC
  Filled 2012-04-28: qty 1

## 2012-04-28 MED ORDER — GLYCOPYRROLATE 0.2 MG/ML IJ SOLN
INTRAMUSCULAR | Status: AC
  Filled 2012-04-28: qty 2

## 2012-04-28 MED ORDER — CEFAZOLIN SODIUM 1-5 GM-% IV SOLN
1.0000 g | INTRAVENOUS | Status: AC
  Administered 2012-04-28: 1 g via INTRAVENOUS

## 2012-04-28 MED ORDER — HYDROMORPHONE HCL PF 1 MG/ML IJ SOLN
1.0000 mg | INTRAMUSCULAR | Status: DC | PRN
  Administered 2012-04-29: 2 mg via INTRAVENOUS
  Administered 2012-04-29: 1 mg via INTRAVENOUS
  Filled 2012-04-28: qty 2
  Filled 2012-04-28: qty 1

## 2012-04-28 MED ORDER — MIDAZOLAM HCL 2 MG/2ML IJ SOLN
1.0000 mg | INTRAMUSCULAR | Status: AC | PRN
  Administered 2012-04-28 (×3): 2 mg via INTRAVENOUS

## 2012-04-28 MED ORDER — HYDROMORPHONE HCL PF 1 MG/ML IJ SOLN
0.2500 mg | INTRAMUSCULAR | Status: DC | PRN
  Administered 2012-04-28 (×4): 0.5 mg via INTRAVENOUS

## 2012-04-28 MED ORDER — ROCURONIUM BROMIDE 100 MG/10ML IV SOLN
INTRAVENOUS | Status: DC | PRN
  Administered 2012-04-28: 50 mg via INTRAVENOUS

## 2012-04-28 MED ORDER — ONDANSETRON HCL 4 MG/2ML IJ SOLN
4.0000 mg | Freq: Once | INTRAMUSCULAR | Status: AC
  Administered 2012-04-28: 4 mg via INTRAVENOUS

## 2012-04-28 MED ORDER — FENTANYL CITRATE 0.05 MG/ML IJ SOLN: 25.0000 ug | INTRAMUSCULAR | Status: DC | PRN

## 2012-04-28 MED ORDER — CEFAZOLIN SODIUM 1-5 GM-% IV SOLN
INTRAVENOUS | Status: AC
  Filled 2012-04-28: qty 50

## 2012-04-28 MED ORDER — ONDANSETRON HCL 4 MG/2ML IJ SOLN: 4.0000 mg | Freq: Once | INTRAMUSCULAR | Status: DC | PRN

## 2012-04-28 MED ORDER — BUPIVACAINE-EPINEPHRINE 0.5% -1:200000 IJ SOLN
INTRAMUSCULAR | Status: DC | PRN
  Administered 2012-04-28: 7 mL

## 2012-04-28 MED ORDER — BUPROPION HCL ER (SR) 150 MG PO TB12
150.0000 mg | ORAL_TABLET | Freq: Two times a day (BID) | ORAL | Status: DC
Start: 2012-04-28 — End: 2012-04-29
  Filled 2012-04-28 (×4): qty 1

## 2012-04-28 MED ORDER — ONDANSETRON HCL 4 MG/2ML IJ SOLN
INTRAMUSCULAR | Status: AC
  Filled 2012-04-28: qty 2

## 2012-04-28 MED ORDER — LACTATED RINGERS IV SOLN
INTRAVENOUS | Status: DC
  Administered 2012-04-28 (×2): via INTRAVENOUS

## 2012-04-28 MED ORDER — DOCUSATE SODIUM 100 MG PO CAPS
100.0000 mg | ORAL_CAPSULE | Freq: Two times a day (BID) | ORAL | Status: DC
  Administered 2012-04-28 – 2012-04-29 (×2): 100 mg via ORAL
  Filled 2012-04-28 (×2): qty 1

## 2012-04-28 MED ORDER — GLYCOPYRROLATE 0.2 MG/ML IJ SOLN
INTRAMUSCULAR | Status: DC | PRN
  Administered 2012-04-28: .5 mg via INTRAVENOUS

## 2012-04-28 MED ORDER — LIDOCAINE HCL (PF) 1 % IJ SOLN
INTRAMUSCULAR | Status: AC
  Filled 2012-04-28: qty 5

## 2012-04-28 MED ORDER — STERILE WATER FOR IRRIGATION IR SOLN
Status: DC | PRN
  Administered 2012-04-28: 1000 mL

## 2012-04-28 MED ORDER — KCL IN DEXTROSE-NACL 20-5-0.45 MEQ/L-%-% IV SOLN
INTRAVENOUS | Status: DC
  Administered 2012-04-28 – 2012-04-29 (×3): via INTRAVENOUS

## 2012-04-28 SURGICAL SUPPLY — 43 items
APPLIER CLIP 11 MED OPEN (CLIP) ×3
APR CLP MED 11 20 MLT OPN (CLIP) ×2
BAG HAMPER (MISCELLANEOUS) ×3 IMPLANT
CLIP APPLIE 11 MED OPEN (CLIP) ×2 IMPLANT
CLOTH BEACON ORANGE TIMEOUT ST (SAFETY) ×3 IMPLANT
COVER LIGHT HANDLE STERIS (MISCELLANEOUS) ×6 IMPLANT
DECANTER SPIKE VIAL GLASS SM (MISCELLANEOUS) ×3 IMPLANT
DRAPE PROXIMA HALF (DRAPES) ×4 IMPLANT
DRAPE STERI URO 9X17 APER PCH (DRAPES) ×3 IMPLANT
ELECT REM PT RETURN 9FT ADLT (ELECTROSURGICAL) ×3
ELECTRODE REM PT RTRN 9FT ADLT (ELECTROSURGICAL) ×2 IMPLANT
FORMALIN 10 PREFIL 480ML (MISCELLANEOUS) ×3 IMPLANT
GAUZE PACKING 2X5 YD STERILE (GAUZE/BANDAGES/DRESSINGS) ×3 IMPLANT
GLOVE BIOGEL PI IND STRL 7.0 (GLOVE) IMPLANT
GLOVE BIOGEL PI IND STRL 7.5 (GLOVE) IMPLANT
GLOVE BIOGEL PI IND STRL 8 (GLOVE) ×2 IMPLANT
GLOVE BIOGEL PI INDICATOR 7.0 (GLOVE) ×1
GLOVE BIOGEL PI INDICATOR 7.5 (GLOVE) ×1
GLOVE BIOGEL PI INDICATOR 8 (GLOVE) ×1
GLOVE ECLIPSE 6.5 STRL STRAW (GLOVE) ×1 IMPLANT
GLOVE ECLIPSE 7.0 STRL STRAW (GLOVE) ×1 IMPLANT
GLOVE ECLIPSE 8.0 STRL XLNG CF (GLOVE) ×3 IMPLANT
GLOVE EXAM NITRILE MD LF STRL (GLOVE) ×1 IMPLANT
GOWN STRL REIN XL XLG (GOWN DISPOSABLE) ×10 IMPLANT
IV NS IRRIG 3000ML ARTHROMATIC (IV SOLUTION) ×3 IMPLANT
KIT ROOM TURNOVER AP CYSTO (KITS) ×3 IMPLANT
KIT SURGICAL DEVON (SET/KITS/TRAYS/PACK) ×1 IMPLANT
MANIFOLD NEPTUNE II (INSTRUMENTS) ×3 IMPLANT
NEEDLE HYPO 22GX1.5 SAFETY (NEEDLE) ×3 IMPLANT
NS IRRIG 1000ML POUR BTL (IV SOLUTION) ×3 IMPLANT
PACK PERI GYN (CUSTOM PROCEDURE TRAY) ×3 IMPLANT
PAD ARMBOARD 7.5X6 YLW CONV (MISCELLANEOUS) ×3 IMPLANT
SET BASIN LINEN APH (SET/KITS/TRAYS/PACK) ×3 IMPLANT
SET IV ADMIN VERSALIGHT (MISCELLANEOUS) ×3 IMPLANT
STRIP CLOSURE SKIN 1/2X4 (GAUZE/BANDAGES/DRESSINGS) ×1 IMPLANT
SUT MNCRL+ AB 3-0 CT1 36 (SUTURE) ×2 IMPLANT
SUT MONOCRYL AB 3-0 CT1 36IN (SUTURE) ×1
SUT VIC AB 0 CT1 27 (SUTURE) ×6
SUT VIC AB 0 CT1 27XCR 8 STRN (SUTURE) ×4 IMPLANT
SUT VIC AB 0 CT2 8-18 (SUTURE) ×3 IMPLANT
SYR CONTROL 10ML LL (SYRINGE) ×3 IMPLANT
TRAY FOLEY CATH 14FR (SET/KITS/TRAYS/PACK) ×3 IMPLANT
VERSALIGHT (MISCELLANEOUS) ×3 IMPLANT

## 2012-04-28 NOTE — Op Note (Signed)
Preoperative diagnosis:  1.  Bone dyspareunia                                         2.  severe dysmenorrhea                                         3.  menorrhagia                                         4.  grade 2 uterine prolapse  Postoperative diagnosis:  Same as above   Procedure:  Vaginal hysterectomy plus removal of implanon  Surgeon:  [name] MD  Anesthesia:  General Endotracheal  Findings:  The patient has suffered with dyspareunia now for several years.  We characterize 8 years ago as blunt dyspareunia with the pain being recreated with extension of the uterosacral ligaments by manipulating the cervix.  However I told the patient that I warned her weight several years before she proceed with a definitive surgery due to her age and only having one child.  She kept her into the bard estimated 28 and I told her that I would do it at that point if she was still having problems.  She does have a grade 2 uterine prolapse and I warned her that in the future she probably will have anterior compartment prolapse as well although certainly nothing needs to be done at this time.  She also suffers with severe dysmenorrhea and menorrhagia.  She has definitely completed her childbearing.  Intraoperative findings were normal.  Cervix uterus tubes and ovaries normal she did indeed have grade 2 uterine prolapse there was no endometriosis adhesions or any other abnormalities.    Description of operation:  The patient was taken from the preoperative area to the operating room in stable condition. She was placed in the sitting position and underwent a spinal anesthetic. Once an adequate level of anesthesia was attained she was placed in the dorsal lithotomy position. Patient was prepped and draped in the usual sterile fashion and a Foley catheter was placed.  A weighted speculum was placed and the cervix was grasped with thyroid tenaculums both anteriorly and posteriorly.  0.5% Marcaine plain was  injected in a circumferential fashion about the cervix and the electrocautery unit was used to incise the vagina and push at all cervix.  The posterior cul-de-sac was then entered sharply without difficulty.  The uterosacral ligaments were clamped cut and inspection suture ligated and held.  The cardinal ligaments were then clamped cut transfixion suture ligated and cut. The anterior peritoneum was identified the anterior cul-de-sac was entered sharply without difficulty. The anterior and posterior leaves of the broad ligament were plicated and the uterine vessels were clamped cut and suture ligated. Serial pedicles were taken of the fundus with each pedicle being clamped cut and suture ligated. The utero-ovarian ligaments were crossclamped the uterus was removed and both pedicles were transfixion suture ligated. There was good hemostasis of all the pedicles. The peritoneum was then closed in a pursestring fashion using 3-0 Vicryl. The anterior posterior vagina was closed in interrupted fashion with good resultant hemostasis. Our closure the lower pelvis and vagina were irrigated  vigorously.  The sponge needle and instrument counts were correct x 3.  Total blood loss for the procedure was 100 cc.  The patient received 1 g of Ancef  preoperatively prophylactically.  She was taken to the recovery room in good stable condition awake alert doing well.  Sheri Wyatt H 04/28/2012, 4:22 PM

## 2012-04-28 NOTE — Anesthesia Preprocedure Evaluation (Signed)
Anesthesia Evaluation  Patient identified by MRN, date of birth, ID band Patient awake    History of Anesthesia Complications Negative for: history of anesthetic complications  Airway Mallampati: II TM Distance: <3 FB Neck ROM: Full    Dental  (+) Teeth Intact   Pulmonary asthma (inactive) ,  breath sounds clear to auscultation        Cardiovascular negative cardio ROS  Rhythm:Regular Rate:Normal     Neuro/Psych Anxiety    GI/Hepatic GERD-  Controlled,  Endo/Other    Renal/GU      Musculoskeletal   Abdominal   Peds  Hematology   Anesthesia Other Findings   Reproductive/Obstetrics                           Anesthesia Physical Anesthesia Plan  ASA: II  Anesthesia Plan: General   Post-op Pain Management:    Induction: Intravenous, Rapid sequence and Cricoid pressure planned  Airway Management Planned: Oral ETT  Additional Equipment:   Intra-op Plan:   Post-operative Plan: Extubation in OR  Informed Consent: I have reviewed the patients History and Physical, chart, labs and discussed the procedure including the risks, benefits and alternatives for the proposed anesthesia with the patient or authorized representative who has indicated his/her understanding and acceptance.     Plan Discussed with:   Anesthesia Plan Comments:         Anesthesia Quick Evaluation

## 2012-04-28 NOTE — Progress Notes (Signed)
Patient refused Wellbutrin that was scheduled for 2200. Patient states that she does not take Wellbutrin any longer, that she quit taking about a month ago.

## 2012-04-28 NOTE — Anesthesia Procedure Notes (Addendum)
Procedure Name: Intubation Date/Time: 04/28/2012 3:12 PM Performed by: Glynn Octave E Pre-anesthesia Checklist: Patient identified, Patient being monitored, Timeout performed, Emergency Drugs available and Suction available Patient Re-evaluated:Patient Re-evaluated prior to inductionOxygen Delivery Method: Circle System Utilized Preoxygenation: Pre-oxygenation with 100% oxygen Intubation Type: IV induction, Rapid sequence and Cricoid Pressure applied Ventilation: Mask ventilation without difficulty Laryngoscope Size: Mac and 3 Grade View: Grade I Tube type: Oral Tube size: 7.0 mm Number of attempts: 1 Airway Equipment and Method: stylet Placement Confirmation: ETT inserted through vocal cords under direct vision,  positive ETCO2 and breath sounds checked- equal and bilateral Secured at: 21 cm Tube secured with: Tape Dental Injury: Teeth and Oropharynx as per pre-operative assessment

## 2012-04-28 NOTE — Transfer of Care (Signed)
Immediate Anesthesia Transfer of Care Note  Patient: Sheri Wyatt  Procedure(s) Performed: Procedure(s) (LRB): HYSTERECTOMY VAGINAL (N/A) REMOVAL OF NORPLANT (N/A)  Patient Location: PACU  Anesthesia Type: General  Level of Consciousness: awake, alert  and oriented  Airway & Oxygen Therapy: Patient Spontanous Breathing  Post-op Assessment: Report given to PACU RN  Post vital signs: Reviewed and stable  Complications: No apparent anesthesia complications

## 2012-04-28 NOTE — H&P (Signed)
Sheri Wyatt is an 26 y.o. female  G 1 P1 with long history of bump dyspareunia, severe, heavy menstrual periods, with severe dysmenorrhea.  We discussed all options but ultimately the only one that solves her cervical bump dyspareunia is a vaginal hysterectomy.  She also has a Grade II uterine prolapse, no bladder or rectum prolapse   Patient's last menstrual period was 04/14/2012.    Past Medical History  Diagnosis Date  . Interstitial cystitis   . Asthma   . Melanoma   . Anxiety     Past Surgical History  Procedure Date  . Tongue surgery     as child    History reviewed. No pertinent family history.  Social History:  reports that she quit smoking about 9 years ago. Her smoking use included Cigarettes. She has a .375 pack-year smoking history. She does not have any smokeless tobacco history on file. She reports that she does not drink alcohol or use illicit drugs.  Allergies:  Allergies  Allergen Reactions  . Corn-Containing Products     Prescriptions prior to admission  Medication Sig Dispense Refill  . buPROPion (WELLBUTRIN SR) 150 MG 12 hr tablet Take 150 mg by mouth 2 (two) times daily.        ROS  Review of Systems  Constitutional: Negative for fever, chills, weight loss, malaise/fatigue and diaphoresis.  HENT: Negative for hearing loss, ear pain, nosebleeds, congestion, sore throat, neck pain, tinnitus and ear discharge.   Eyes: Negative for blurred vision, double vision, photophobia, pain, discharge and redness.  Respiratory: Negative for cough, hemoptysis, sputum production, shortness of breath, wheezing and stridor.   Cardiovascular: Negative for chest pain, palpitations, orthopnea, claudication, leg swelling and PND.  Gastrointestinal: Positive for abdominal pain. Negative for heartburn, nausea, vomiting, diarrhea, constipation, blood in stool and melena.  Genitourinary: Negative for dysuria, urgency, frequency, hematuria and flank pain.  Positive for pain  with intercourse  Musculoskeletal: Negative for myalgias, back pain, joint pain and falls.  Skin: Negative for itching and rash.  Neurological: Negative for dizziness, tingling, tremors, sensory change, speech change, focal weakness, seizures, loss of consciousness, weakness and headaches.  Endo/Heme/Allergies: Negative for environmental allergies and polydipsia. Does not bruise/bleed easily.  Psychiatric/Behavioral: Negative for depression, suicidal ideas, hallucinations, memory loss and substance abuse. The patient is not nervous/anxious and does not have insomnia.      Blood pressure 106/72, pulse 80, temperature 98.4 F (36.9 C), temperature source Oral, resp. rate 28, last menstrual period 04/14/2012, SpO2 98.00%. Physical Exam Physical Exam  Vitals reviewed. Constitutional: She is oriented to person, place, and time. She appears well-developed and well-nourished.  HENT:  Head: Normocephalic and atraumatic.  Right Ear: External ear normal.  Left Ear: External ear normal.  Nose: Nose normal.  Mouth/Throat: Oropharynx is clear and moist.  Eyes: Conjunctivae and EOM are normal. Pupils are equal, round, and reactive to light. Right eye exhibits no discharge. Left eye exhibits no discharge. No scleral icterus.  Neck: Normal range of motion. Neck supple. No tracheal deviation present. No thyromegaly present.  Cardiovascular: Normal rate, regular rhythm, normal heart sounds and intact distal pulses.  Exam reveals no gallop and no friction rub.   No murmur heard. Respiratory: Effort normal and breath sounds normal. No respiratory distress. She has no wheezes. She has no rales. She exhibits no tenderness.  GI: Soft. Bowel sounds are normal. She exhibits no distension and no mass. There is tenderness. There is no rebound and no guarding.  Genitourinary:  Vulva is normal without lesions Vagina is pink moist without discharge Cervix normal in appearance and pap is normal Uterus is normal  with positive pain on cervical manipulation and uterosacral ligament stretching Adnexa is negative with normal sized ovaries by sonogram  Musculoskeletal: Normal range of motion. She exhibits no edema and no tenderness.  Neurological: She is alert and oriented to person, place, and time. She has normal reflexes. She displays normal reflexes. No cranial nerve deficit. She exhibits normal muscle tone. Coordination normal.  Skin: Skin is warm and dry. No rash noted. No erythema. No pallor.  Psychiatric: She has a normal mood and affect. Her behavior is normal. Judgment and thought content normal.   Recent Results (from the past 336 hour(s))  SURGICAL PCR SCREEN   Collection Time   04/21/12 11:09 AM      Component Value Range   MRSA, PCR NEGATIVE  NEGATIVE   Staphylococcus aureus NEGATIVE  NEGATIVE  CBC   Collection Time   04/21/12 11:30 AM      Component Value Range   WBC 7.1  4.0 - 10.5 K/uL   RBC 4.65  3.87 - 5.11 MIL/uL   Hemoglobin 13.4  12.0 - 15.0 g/dL   HCT 29.5  62.1 - 30.8 %   MCV 84.7  78.0 - 100.0 fL   MCH 28.8  26.0 - 34.0 pg   MCHC 34.0  30.0 - 36.0 g/dL   RDW 65.7  84.6 - 96.2 %   Platelets 192  150 - 400 K/uL  COMPREHENSIVE METABOLIC PANEL   Collection Time   04/21/12 11:30 AM      Component Value Range   Sodium 138  135 - 145 mEq/L   Potassium 4.0  3.5 - 5.1 mEq/L   Chloride 102  96 - 112 mEq/L   CO2 25  19 - 32 mEq/L   Glucose, Bld 72  70 - 99 mg/dL   BUN 14  6 - 23 mg/dL   Creatinine, Ser 9.52  0.50 - 1.10 mg/dL   Calcium 9.7  8.4 - 84.1 mg/dL   Total Protein 7.1  6.0 - 8.3 g/dL   Albumin 4.0  3.5 - 5.2 g/dL   AST 15  0 - 37 U/L   ALT 10  0 - 35 U/L   Alkaline Phosphatase 50  39 - 117 U/L   Total Bilirubin 0.3  0.3 - 1.2 mg/dL   GFR calc non Af Amer >90  >90 mL/min   GFR calc Af Amer >90  >90 mL/min  HCG, QUANTITATIVE, PREGNANCY   Collection Time   04/21/12 11:30 AM      Component Value Range   hCG, Beta Chain, Quant, S <1  <5 mIU/mL  TYPE AND SCREEN    Collection Time   04/21/12 11:30 AM      Component Value Range   ABO/RH(D) O NEG     Antibody Screen NEG     Sample Expiration 05/12/2012           Assessment/Plan: 1.  Dyspareunia, cervical mediated 2.  Grade II uterine prolapse 3.  Dysmenorrhea  Pt understands the risks of surgery including but not limited t  excessive bleeding requiring transfusion or reoperation, post-operative infection requiring prolonged hospitalization or re-hospitalization and antibiotic therapy, and damage to other organs including bladder, bowel, ureters and major vessels.  The patient also understands the alternative treatment options which were discussed in full.  All questions were answered.   EURE,LUTHER H 04/28/2012, 2:53 PM

## 2012-04-28 NOTE — Anesthesia Postprocedure Evaluation (Signed)
  Anesthesia Post-op Note  Patient: Sheri Wyatt  Procedure(s) Performed: Procedure(s) (LRB): HYSTERECTOMY VAGINAL (N/A) REMOVAL OF NORPLANT (N/A)  Patient Location: PACU  Anesthesia Type: General  Level of Consciousness: awake, alert  and oriented  Airway and Oxygen Therapy: Patient Spontanous Breathing and Patient connected to face mask oxygen  Post-op Pain: none  Post-op Assessment: Post-op Vital signs reviewed, Patient's Cardiovascular Status Stable, Respiratory Function Stable, Patent Airway and No signs of Nausea or vomiting  Post-op Vital Signs: Reviewed and stable  Complications: No apparent anesthesia complications

## 2012-04-29 ENCOUNTER — Encounter (HOSPITAL_COMMUNITY): Payer: Self-pay | Admitting: Obstetrics & Gynecology

## 2012-04-29 LAB — CBC
HCT: 32.6 % — ABNORMAL LOW (ref 36.0–46.0)
RDW: 12.5 % (ref 11.5–15.5)
WBC: 9.1 10*3/uL (ref 4.0–10.5)

## 2012-04-29 NOTE — Addendum Note (Signed)
Addendum  created 04/29/12 1119 by Kay Shippy S Baxter Gonzalez, CRNA   Modules edited:Notes Section    

## 2012-04-29 NOTE — Anesthesia Postprocedure Evaluation (Signed)
Anesthesia Post Note  Patient: Sheri Wyatt  Procedure(s) Performed: Procedure(s) (LRB): HYSTERECTOMY VAGINAL (N/A) REMOVAL OF NORPLANT (N/A)  Anesthesia type: General  Patient location: 324  Post pain: Pain level controlled  Post assessment: Post-op Vital signs reviewed, Patient's Cardiovascular Status Stable, Respiratory Function Stable, Patent Airway, No signs of Nausea or vomiting and Pain level controlled  Last Vitals:  Filed Vitals:   04/29/12 0550  BP: 91/59  Pulse: 66  Temp: 36.3 C  Resp: 20    Post vital signs: Reviewed and stable  Level of consciousness: awake and alert   Complications: No apparent anesthesia complications

## 2012-04-29 NOTE — Discharge Summary (Signed)
Physician Discharge Summary  Patient ID: OLENE GODFREY MRN: [id number] DOB/AGE: 1986-09-04 26 y.o.  Admit date: 04/28/2012 Discharge date: 04/29/2012  Admission Diagnoses: Dyspareunia and prolapse  Discharge Diagnoses:  same  Discharged Condition: good  Hospital Course: unremarkable  Consults: None  Significant Diagnostic Studies: none  Treatments: surgery: tvh  Discharge Exam: Blood pressure 89/53, pulse 59, temperature 97.8 F (36.6 C), temperature source Oral, resp. rate 18, height 5\' 3"  (1.6 m), weight 59 kg (130 lb 1.1 oz), last menstrual period 04/14/2012, SpO2 95.00%. General appearance: alert and cooperative GI: soft, non-tender; bowel sounds normal; no masses,  no organomegaly  Disposition: 01-Home or Self Care  Discharge Orders    Future Orders Please Complete By Expires   Diet - low sodium heart healthy      Increase activity slowly      Driving Restrictions      Comments:   1 week   Lifting restrictions      Comments:   10 pounds   Sexual Activity Restrictions      Comments:   Are you kidding?  6 weeks   No wound care      Call MD for:  temperature >100.4      Call MD for:  persistant nausea and vomiting      Call MD for:  severe uncontrolled pain        Medication List  As of 04/29/2012  4:24 PM   TAKE these medications         buPROPion 150 MG 12 hr tablet   Commonly known as: WELLBUTRIN SR   Take 150 mg by mouth 2 (two) times daily.           Follow-up Information    Follow up with [name], MD in 1 week.   Contact information:   [facility] [address] [place] [address] [phone]          Signed: [name] 04/29/2012, 4:24 PM

## 2012-04-29 NOTE — Progress Notes (Signed)
Discharge Summary: a/o.vss. Saline lock removed. Up ad lib. Pain controlled. Discharge instructions given pt. Pt verbalized understanding of instructions. Awaiting for family to arrive for discharge,

## 2012-04-29 NOTE — Progress Notes (Signed)
UR chart review completed.  

## 2012-04-30 NOTE — Progress Notes (Signed)
Ur chart review completed.  

## 2013-01-20 ENCOUNTER — Other Ambulatory Visit: Payer: Self-pay | Admitting: Physician Assistant

## 2013-02-19 IMAGING — CT CT ABD-PELV W/O CM
3 of 4 series · 7 of 46 positions shown, 13 images · non-contrast
Comparison: None.

CLINICAL DATA: Generalized abdominal pain

CT ABDOMEN AND PELVIS WITHOUT CONTRAST
TECHNIQUE: Multidetector CT imaging of the abdomen and pelvis was
performed following the standard protocol without intravenous
contrast.

[Series 3: lung 5.0 b60f · axial · 0.56mm/px · z∈[-172,-132]mm · 3 of 16 slices shown, 7 images]
[im 4/16  soft-tissue]
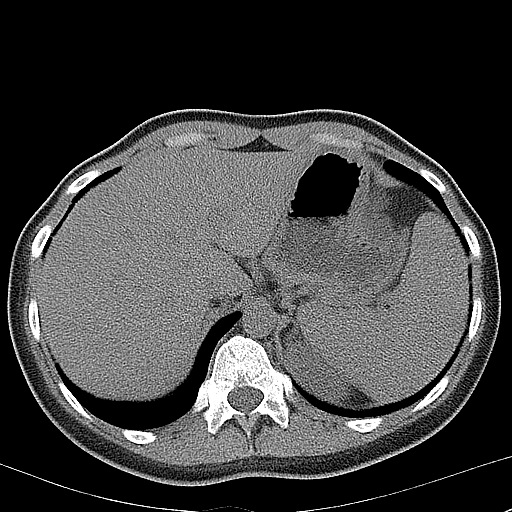
[im 4/16  lung]
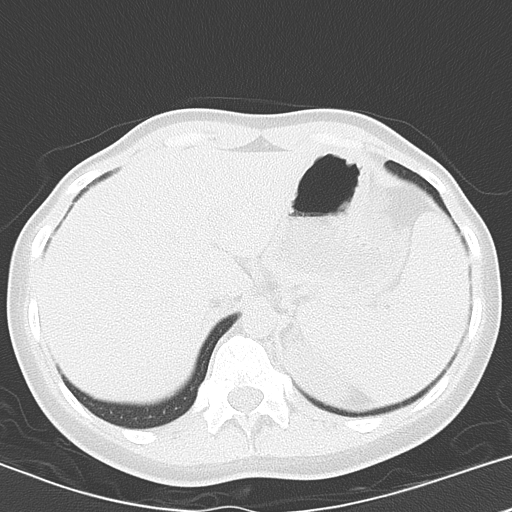
[im 4/16  bone]
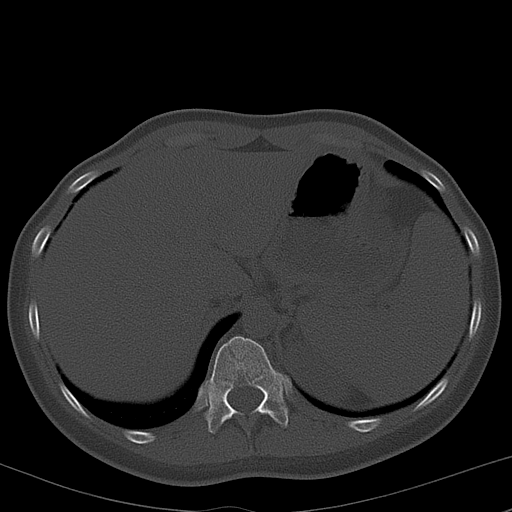
[im 8/16  soft-tissue]
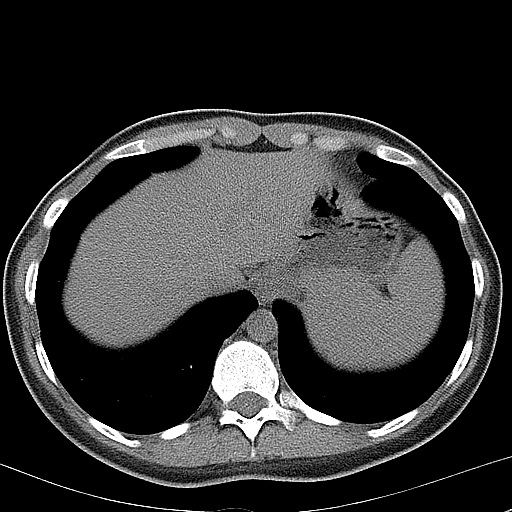
[im 8/16  lung]
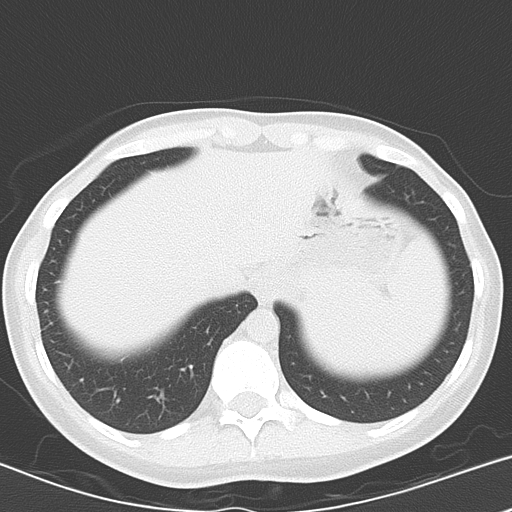
[im 12/16  soft-tissue]
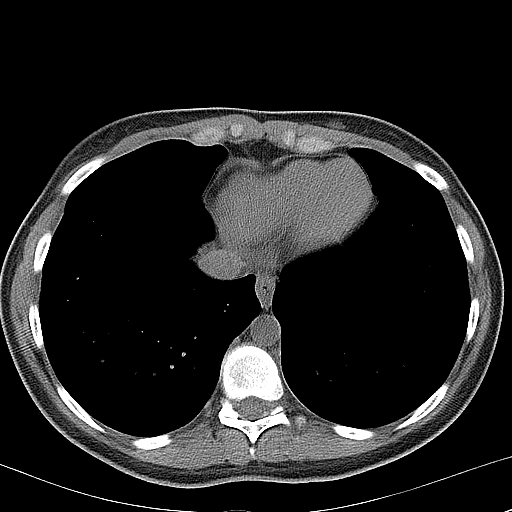
[im 12/16  lung]
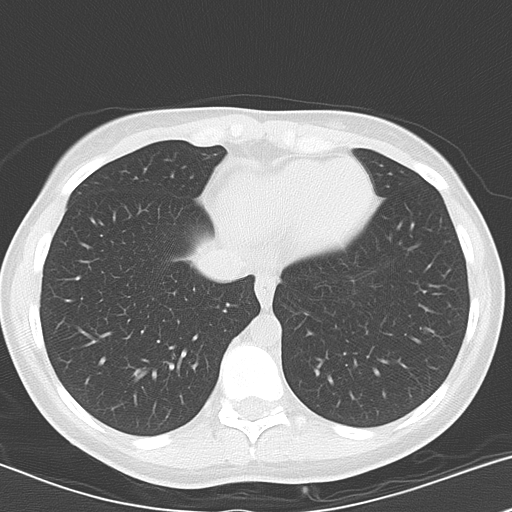

[Series 4: mpr coronal (id) · coronal · 0.60mm/px · 3 of 67 slices shown, 4 images]
[im 23/67  soft-tissue]
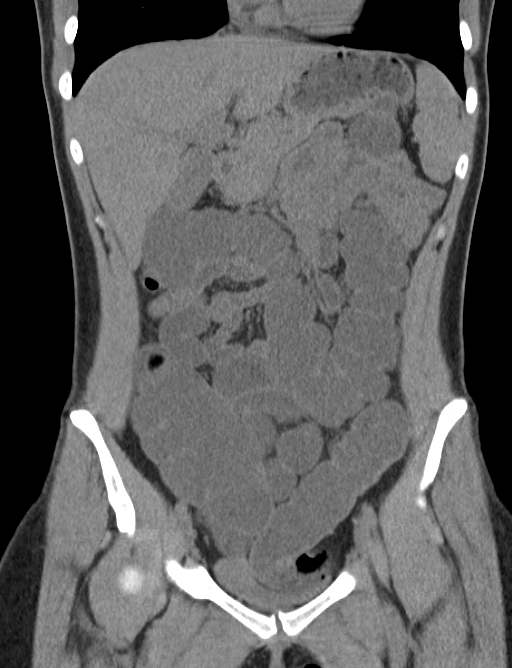
[im 30/67  soft-tissue]
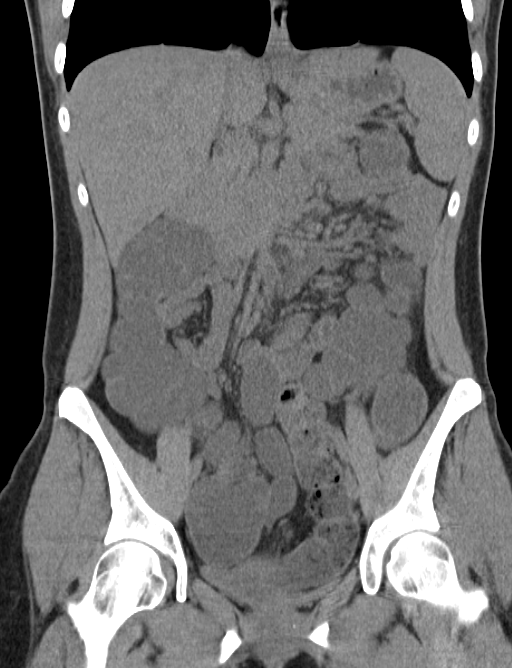
[im 30/67  bone]
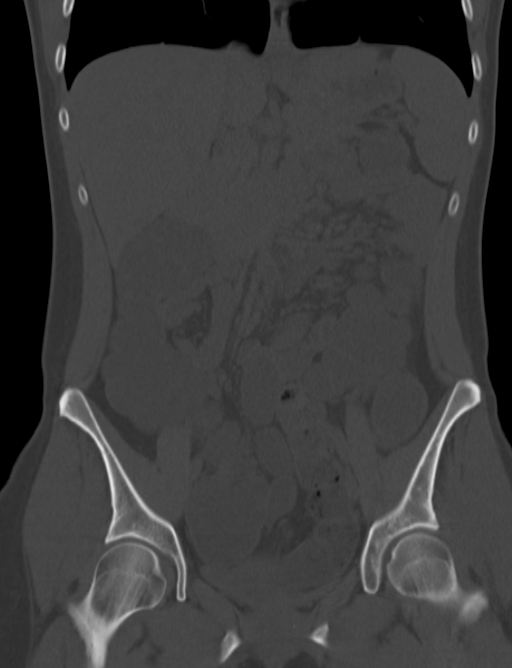
[im 37/67  soft-tissue]
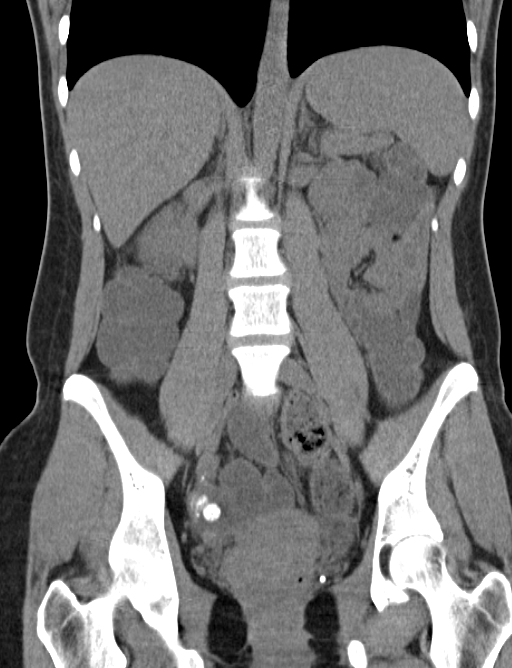

[Series 5: mpr sagittal (id) · sagittal · 0.45mm/px · 1 of 89 slices shown, 2 images]
[im 30/89  soft-tissue]
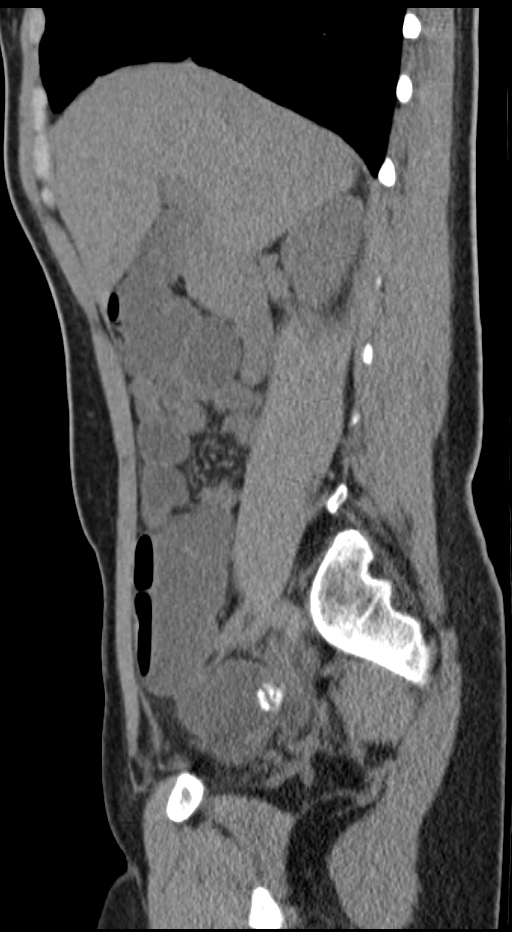
[im 30/89  bone]
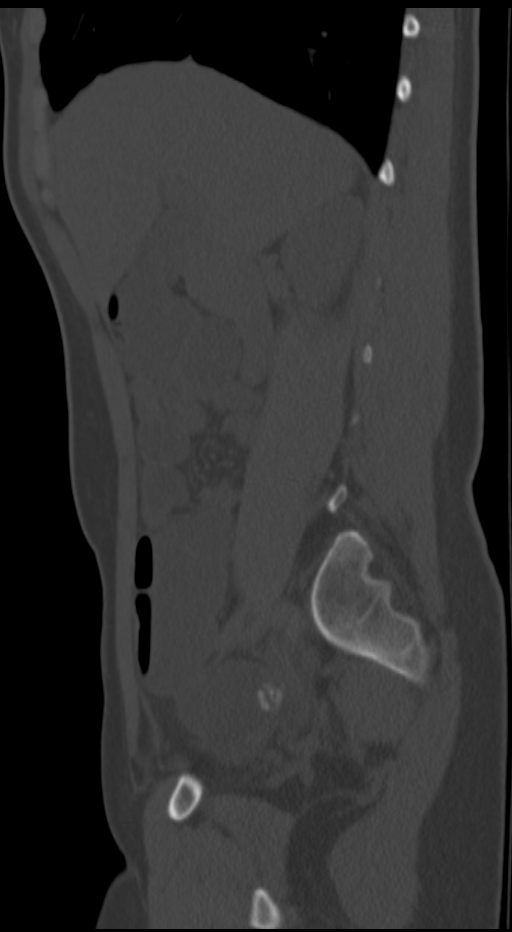

[7 of 46 positions shown; findings below may reference images not displayed]

FINDINGS: Clear lung bases.  Normal heart size.  No pericardial or
pleural effusion.

Abdomen:  Symmetric kidneys without hydronephrosis or obstruction.
No obstructing urinary tract calculus or ureteral calculus
demonstrated.  Kidneys are symmetric in size and position.

Liver, gallbladder, biliary system, pancreas, spleen, and adrenal
glands are within normal limits for noncontrast study.

Diffuse fluid distention of the distal small bowel and colon.
Scattered air fluid levels noted.  No definite obstruction pattern
noted.  Retained stool in the sigmoid and rectum.  Appearance
suggests some degree of distal fecal impaction.  Hyperdense
ingested material or pill fragments noted in the fluid distended
cecum, image 63.  Portions of the appendix are demonstrated and
unremarkable, image 49.

No acute osseous finding

Pelvis:  No distal pelvic dilated ureter.  Pelvic calcifications
appear to be outside the urinary tract consistent with venous
phleboliths.  No pelvic free fluid, fluid collection, hemorrhage,
hematoma, adenopathy, inguinal abnormality, or hernia.
IMPRESSION: Fluid distended distal small bowel and colon with scattered air
fluid levels.  Retained stool in the sigmoid and rectum compatible
with some degree of fecal impaction.  No definite obstruction or
free air.

No acute obstructing urinary tract calculus.

Negative for acute appendicitis

## 2013-08-01 ENCOUNTER — Other Ambulatory Visit: Payer: Self-pay | Admitting: *Deleted

## 2013-08-02 MED ORDER — METH-HYO-M BL-BENZ ACD-PH SAL 81.6 MG PO TABS: 1.0000 | ORAL_TABLET | Freq: Four times a day (QID) | ORAL | Status: DC

## 2014-05-09 ENCOUNTER — Encounter (HOSPITAL_COMMUNITY): Payer: Self-pay | Admitting: Emergency Medicine

## 2014-05-09 ENCOUNTER — Emergency Department (HOSPITAL_COMMUNITY)
Admission: EM | Admit: 2014-05-09 | Discharge: 2014-05-09 | Disposition: A | Discharge status: Home / Self Care | Payer: BC Managed Care – PPO | Attending: Emergency Medicine | Admitting: Emergency Medicine

## 2014-05-09 DIAGNOSIS — R52 Pain, unspecified: Secondary | ICD-10-CM | POA: Insufficient documentation

## 2014-05-09 DIAGNOSIS — M545 Low back pain, unspecified: Secondary | ICD-10-CM | POA: Insufficient documentation

## 2014-05-09 DIAGNOSIS — Z87891 Personal history of nicotine dependence: Secondary | ICD-10-CM | POA: Insufficient documentation

## 2014-05-09 DIAGNOSIS — Z85820 Personal history of malignant melanoma of skin: Secondary | ICD-10-CM | POA: Insufficient documentation

## 2014-05-09 DIAGNOSIS — J45909 Unspecified asthma, uncomplicated: Secondary | ICD-10-CM | POA: Insufficient documentation

## 2014-05-09 DIAGNOSIS — Z8659 Personal history of other mental and behavioral disorders: Secondary | ICD-10-CM | POA: Insufficient documentation

## 2014-05-09 DIAGNOSIS — Z8719 Personal history of other diseases of the digestive system: Secondary | ICD-10-CM | POA: Insufficient documentation

## 2014-05-09 DIAGNOSIS — Z79899 Other long term (current) drug therapy: Secondary | ICD-10-CM | POA: Insufficient documentation

## 2014-05-09 MED ORDER — OXYCODONE-ACETAMINOPHEN 5-325 MG PO TABS: 2.0000 | ORAL_TABLET | ORAL | Status: DC | PRN

## 2014-05-09 MED ORDER — FENTANYL CITRATE 0.05 MG/ML IJ SOLN
100.0000 ug | Freq: Once | INTRAMUSCULAR | Status: AC
  Administered 2014-05-09: 100 ug via NASAL
  Filled 2014-05-09: qty 2

## 2014-05-09 NOTE — ED Notes (Signed)
Having lower back since this am.resting pain is 3-4 and 10 when moving

## 2014-05-09 NOTE — ED Provider Notes (Signed)
CSN: [id number]     Arrival date & time 05/09/14  1653 History   First MD Initiated Contact with Patient 05/09/14 1702     Chief Complaint  Patient presents with  . Back Pain     (Consider location/radiation/quality/duration/timing/severity/associated sxs/prior Treatment) HPI 28 year old female woke up this morning with some mild low back pain but then sneezed and had sudden severe midline low back pain without radiation without associated weakness numbness or change in bowel bladder function without fever without rash without abdominal pain but she did develop some nausea and vomited once 2 or positional severe pain which is better she stays still worse if she moves although she is able to walk supporting herself with a painful gait. There is no treatment prior to arrival. Past Medical History  Diagnosis Date  . Interstitial cystitis   . Asthma   . Melanoma   . Anxiety    Past Surgical History  Procedure Laterality Date  . Tongue surgery      as child  . Vaginal hysterectomy  04/28/2012    Procedure: HYSTERECTOMY VAGINAL;  Surgeon: Florian Buff, MD;  Location: [facility];  Service: Gynecology;  Laterality: N/A;  . Norplant removal  04/28/2012    Procedure: REMOVAL OF NORPLANT;  Surgeon: Florian Buff, MD;  Location: [facility];  Service: Gynecology;  Laterality: N/A;  removal of implanon-started at 1604   History reviewed. No pertinent family history. History  Substance Use Topics  . Smoking status: Former Smoker -- 0.25 packs/day for 1.5 years    Types: Cigarettes    Quit date: 04/22/2003  . Smokeless tobacco: Not on file  . Alcohol Use: No   OB History   Grav Para Term Preterm Abortions TAB SAB Ect Mult Living                 Review of Systems 10 Systems reviewed and are negative for acute change except as noted in the HPI.   Allergies  Corn-containing products  Home Medications   Prior to Admission medications   Medication Sig Start Date End Date Taking? Authorizing  Provider  buPROPion (WELLBUTRIN XL) 150 MG 24 hr tablet Take 75 mg by mouth 2 (two) times daily.   Yes Historical Provider, MD  naproxen sodium (ALEVE) 220 MG tablet Take 440 mg by mouth once as needed (for pain).   Yes Historical Provider, MD  oxyCODONE-acetaminophen (PERCOCET) 5-325 MG per tablet Take 2 tablets by mouth every 4 (four) hours as needed. 05/09/14   Babette Relic, MD   BP 119/76  Pulse 68  Temp(Src) 97.8 F (36.6 C) (Oral)  Resp 18  SpO2 100%  LMP 04/18/2012 Physical Exam  Nursing note and vitals reviewed. Constitutional:  Awake, alert, nontoxic appearance with baseline speech.  HENT:  Head: Atraumatic.  Eyes: Pupils are equal, round, and reactive to light. Right eye exhibits no discharge. Left eye exhibits no discharge.  Neck: Neck supple.  Cardiovascular: Normal rate and regular rhythm.   No murmur heard. Pulmonary/Chest: Effort normal and breath sounds normal. No respiratory distress. She has no wheezes. She has no rales. She exhibits no tenderness.  Abdominal: Soft. Bowel sounds are normal. She exhibits no distension and no mass. There is no tenderness. There is no rebound and no guarding.  Musculoskeletal: She exhibits no edema and no tenderness.       Thoracic back: She exhibits no tenderness.       Lumbar back: She exhibits no tenderness.  Back is nontender  without rash pain is localized to the lower lumbar midline region. Bilateral lower extremities non tender without new rashes or color change, baseline ROM with intact DP pulses, CR<2 secs all digits bilaterally, sensation baseline light touch bilaterally for pt, DTR's symmetric and intact bilaterally KJ / AJ, motor symmetric bilateral 5 / 5 hip flexion, quadriceps, hamstrings, EHL, foot dorsiflexion, foot plantarflexion, gait somewhat antalgic but without apparent new ataxia.  Neurological: She is alert.  Mental status baseline for patient.  Upper extremity motor strength and sensation intact and symmetric  bilaterally.  Skin: No rash noted.  Psychiatric: She has a normal mood and affect.    ED Course  Procedures (including critical care time) Patient informed of clinical course, understand medical decision-making process, and agree with plan. Pt aware she may have herniated disc. Labs Review Labs Reviewed - No data to display  Imaging Review No results found.   EKG Interpretation None      MDM   Final diagnoses:  Acute low back pain    I doubt any other EMC precluding discharge at this time including, but not necessarily limited to the following:SBI, cauda equina syndrome.     Babette Relic, MD 05/13/14 [phone]

## 2014-05-09 NOTE — Discharge Instructions (Signed)
SEEK IMMEDIATE MEDICAL ATTENTION IF: New numbness, tingling, weakness, or problem with the use of your arms or legs.  Severe back pain not relieved with medications.  Change in bowel or bladder control.  Increasing pain in any areas of the body (such as chest or abdominal pain).  Shortness of breath, dizziness or fainting.  Nausea (feeling sick to your stomach), vomiting, fever, or sweats.  

## 2014-08-08 ENCOUNTER — Encounter: Payer: Self-pay | Admitting: Obstetrics & Gynecology

## 2014-08-08 ENCOUNTER — Ambulatory Visit (INDEPENDENT_AMBULATORY_CARE_PROVIDER_SITE_OTHER): Payer: BC Managed Care – PPO | Admitting: Obstetrics & Gynecology

## 2014-08-08 VITALS — BP 100/80 | Temp 98.4°F | Ht 63.0 in | Wt 142.0 lb

## 2014-08-08 DIAGNOSIS — R319 Hematuria, unspecified: Secondary | ICD-10-CM

## 2014-08-08 DIAGNOSIS — N390 Urinary tract infection, site not specified: Secondary | ICD-10-CM

## 2014-08-08 LAB — POCT URINALYSIS DIPSTICK
Glucose, UA: NEGATIVE
KETONES UA: NEGATIVE
Nitrite, UA: POSITIVE
Protein, UA: 1
RBC UA: 2

## 2014-08-08 MED ORDER — CIPROFLOXACIN HCL 500 MG PO TABS: 500.0000 mg | ORAL_TABLET | Freq: Two times a day (BID) | ORAL | Status: DC

## 2014-08-08 MED ORDER — TERCONAZOLE 0.4 % VA CREA: 1.0000 | TOPICAL_CREAM | Freq: Every day | VAGINAL | Status: DC

## 2014-08-08 NOTE — Progress Notes (Signed)
Patient ID: Sheri Wyatt, female   DOB: [birth year], 28 y.o.   MRN: [id number] Pt has been having symptoms and has a history of IC but urine today +nitrates Blood pressure 100/80, temperature 98.4 F (36.9 C), height 5\' 3"  (1.6 m), weight 142 lb (64.411 kg), last menstrual period 04/18/2012.  Will use cipro x 7 days terazol prn

## 2014-08-08 NOTE — Addendum Note (Signed)
Addended by: Doyne Keel on: 08/08/2014 12:09 PM   Modules accepted: Orders

## 2014-08-11 LAB — URINE CULTURE: Colony Count: >=100000

## 2015-01-16 ENCOUNTER — Ambulatory Visit: Payer: Self-pay | Admitting: Obstetrics & Gynecology

## 2015-01-18 ENCOUNTER — Ambulatory Visit (INDEPENDENT_AMBULATORY_CARE_PROVIDER_SITE_OTHER): Payer: BLUE CROSS/BLUE SHIELD | Admitting: Obstetrics & Gynecology

## 2015-01-18 ENCOUNTER — Encounter: Payer: Self-pay | Admitting: Obstetrics & Gynecology

## 2015-01-18 VITALS — BP 90/70 | HR 68 | Ht 63.0 in | Wt 141.5 lb

## 2015-01-18 DIAGNOSIS — N3010 Interstitial cystitis (chronic) without hematuria: Secondary | ICD-10-CM | POA: Diagnosis not present

## 2015-01-18 DIAGNOSIS — R309 Painful micturition, unspecified: Secondary | ICD-10-CM

## 2015-01-18 LAB — POCT URINALYSIS DIPSTICK
Blood, UA: NEGATIVE
Glucose, UA: NEGATIVE
KETONES UA: NEGATIVE
Leukocytes, UA: NEGATIVE
Nitrite, UA: NEGATIVE
PROTEIN UA: NEGATIVE

## 2015-01-18 MED ORDER — URELLE 81 MG PO TABS: 1.0000 | ORAL_TABLET | Freq: Four times a day (QID) | ORAL | Status: DC

## 2015-01-18 NOTE — Progress Notes (Signed)
Patient ID: Sheri Wyatt, female   DOB: 09/11/1986, 29 y.o.   MRN: [id number] Blood pressure 90/70, pulse 68, height 5\' 3"  (1.6 m), weight 141 lb 8 oz (64.184 kg), last menstrual period 04/18/2012.  Urinalysis: negative  Pt with known history of interstitial cystitis for years but has infrequent issues now.  Came in because didn't know if it was a UTI or IC.  Informed urine was negative for evidence of infection.  No longer taking proced, not available?, will try urelle and see if gets filled  Also had discussion regarding family members also possibly with IC   Once again addressed issues related to sexual performance and orgasm.  Is finally able to have them on a somewhat irregular basis. Prior to this relationship never had before  At next yearly will evaluate possible postiional explanations     Face to face time:  15 minutes  Greater than 50% of the visit time was spent in counseling and coordination of care with the patient.  The summary and outline of the counseling and care coordination is summarized in the note above.   All questions were answered.

## 2015-02-12 ENCOUNTER — Other Ambulatory Visit: Payer: BLUE CROSS/BLUE SHIELD | Admitting: Obstetrics & Gynecology

## 2015-07-09 ENCOUNTER — Other Ambulatory Visit: Payer: BLUE CROSS/BLUE SHIELD | Admitting: Obstetrics & Gynecology

## 2015-07-18 ENCOUNTER — Other Ambulatory Visit: Payer: BLUE CROSS/BLUE SHIELD | Admitting: Obstetrics & Gynecology

## 2016-02-19 DIAGNOSIS — L720 Epidermal cyst: Secondary | ICD-10-CM | POA: Diagnosis not present

## 2016-03-21 DIAGNOSIS — F4312 Post-traumatic stress disorder, chronic: Secondary | ICD-10-CM | POA: Diagnosis not present

## 2016-03-21 DIAGNOSIS — J069 Acute upper respiratory infection, unspecified: Secondary | ICD-10-CM | POA: Diagnosis not present

## 2016-03-21 DIAGNOSIS — Z1389 Encounter for screening for other disorder: Secondary | ICD-10-CM | POA: Diagnosis not present

## 2016-03-21 DIAGNOSIS — Z6825 Body mass index (BMI) 25.0-25.9, adult: Secondary | ICD-10-CM | POA: Diagnosis not present

## 2016-04-25 DIAGNOSIS — F4312 Post-traumatic stress disorder, chronic: Secondary | ICD-10-CM | POA: Diagnosis not present

## 2016-04-25 DIAGNOSIS — Z6825 Body mass index (BMI) 25.0-25.9, adult: Secondary | ICD-10-CM | POA: Diagnosis not present

## 2016-04-25 DIAGNOSIS — Z1389 Encounter for screening for other disorder: Secondary | ICD-10-CM | POA: Diagnosis not present

## 2016-04-28 ENCOUNTER — Encounter: Payer: Self-pay | Admitting: Obstetrics & Gynecology

## 2016-04-28 ENCOUNTER — Ambulatory Visit (INDEPENDENT_AMBULATORY_CARE_PROVIDER_SITE_OTHER): Payer: BLUE CROSS/BLUE SHIELD | Admitting: Obstetrics & Gynecology

## 2016-04-28 VITALS — BP 100/70 | HR 76 | Ht 63.0 in | Wt 143.0 lb

## 2016-04-28 DIAGNOSIS — Z01419 Encounter for gynecological examination (general) (routine) without abnormal findings: Secondary | ICD-10-CM

## 2016-04-28 MED ORDER — BUTALBITAL-APAP-CAFFEINE 50-325-40 MG PO TABS
1.0000 | ORAL_TABLET | Freq: Four times a day (QID) | ORAL | Status: AC | PRN
Start: 2016-04-28 — End: 2017-04-28

## 2016-04-28 NOTE — Progress Notes (Signed)
Patient ID: Sheri Wyatt, female   DOB: 06/09/1986, 30 y.o.   MRN: [id number] Subjective:     Sheri Wyatt is a 30 y.o. female here for a routine exam.  Patient's last menstrual period was 04/18/2012. No obstetric history on file. Birth Control Method:  hsyterectomy Menstrual Calendar(currently): na  Current complaints: sexual function, chronic.   Current acute medical issues:  none   Recent Gynecologic History Patient's last menstrual period was 04/18/2012. Last Pap: na,  normal Last mammogram: ,    Past Medical History:  Diagnosis Date  . Anxiety   . Asthma   . Interstitial cystitis   . Melanoma ([place])   . Melanoma [facility])     Past Surgical History:  Procedure Laterality Date  . NORPLANT REMOVAL  04/28/2012   Procedure: REMOVAL OF NORPLANT;  Surgeon: Florian Buff, MD;  Location: [facility];  Service: Gynecology;  Laterality: N/A;  removal of implanon-started at 1604  . TONGUE SURGERY     as child  . VAGINAL HYSTERECTOMY  04/28/2012   Procedure: HYSTERECTOMY VAGINAL;  Surgeon: Florian Buff, MD;  Location: [facility];  Service: Gynecology;  Laterality: N/A;    OB History    No data available      Social History   Social History  . Marital status: Legally Separated    Spouse name: N/A  . Number of children: N/A  . Years of education: N/A   Social History Main Topics  . Smoking status: Former Smoker    Packs/day: 0.25    Years: 1.50    Types: Cigarettes    Quit date: 04/22/2003  . Smokeless tobacco: Never Used  . Alcohol use 0.0 oz/week     Comment: occassional  . Drug use: No     Comment: cocoaine- and ectasy 8 yrs ago  . Sexual activity: Yes    Birth control/ protection: Surgical   Other Topics Concern  . None   Social History Narrative  . None    Family History  Problem Relation Age of Onset  . Cancer Mother     melanoma  . Hypertension Mother   . Asthma Daughter   . Heart disease Maternal Grandfather   . Seizures Maternal Grandfather     mini   . Cancer Paternal Grandfather     prostate      Current Outpatient Prescriptions:  .  busPIRone (BUSPAR) 10 MG tablet, Take 10 mg by mouth 2 (two) times daily., Disp: , Rfl:  .  URELLE (URELLE/URISED) 81 MG TABS tablet, Take 1 tablet (81 mg total) by mouth 4 (four) times daily., Disp: 120 each, Rfl: 4 .  butalbital-acetaminophen-caffeine (FIORICET) 50-325-40 MG tablet, Take 1-2 tablets by mouth every 6 (six) hours as needed for headache., Disp: 30 tablet, Rfl: 0  Review of Systems  Review of Systems  Constitutional: Negative for fever, chills, weight loss, malaise/fatigue and diaphoresis.  HENT: Negative for hearing loss, ear pain, nosebleeds, congestion, sore throat, neck pain, tinnitus and ear discharge.   Eyes: Negative for blurred vision, double vision, photophobia, pain, discharge and redness.  Respiratory: Negative for cough, hemoptysis, sputum production, shortness of breath, wheezing and stridor.   Cardiovascular: Negative for chest pain, palpitations, orthopnea, claudication, leg swelling and PND.  Gastrointestinal: negative for abdominal pain. Negative for heartburn, nausea, vomiting, diarrhea, constipation, blood in stool and melena.  Genitourinary: Negative for dysuria, urgency, frequency, hematuria and flank pain.  Musculoskeletal: Negative for myalgias, back pain, joint pain and falls.  Skin: Negative  for itching and rash.  Neurological: Negative for dizziness, tingling, tremors, sensory change, speech change, focal weakness, seizures, loss of consciousness, weakness and headaches.  Endo/Heme/Allergies: Negative for environmental allergies and polydipsia. Does not bruise/bleed easily.  Psychiatric/Behavioral: Negative for depression, suicidal ideas, hallucinations, memory loss and substance abuse. The patient is not nervous/anxious and does not have insomnia.        Objective:  Blood pressure 100/70, pulse 76, height 5\' 3"  (1.6 m), weight 143 lb (64.9 kg), last menstrual  period 04/18/2012.   Physical Exam  Vitals reviewed. Constitutional: She is oriented to person, place, and time. She appears well-developed and well-nourished.  HENT:  Head: Normocephalic and atraumatic.        Right Ear: External ear normal.  Left Ear: External ear normal.  Nose: Nose normal.  Mouth/Throat: Oropharynx is clear and moist.  Eyes: Conjunctivae and EOM are normal. Pupils are equal, round, and reactive to light. Right eye exhibits no discharge. Left eye exhibits no discharge. No scleral icterus.  Neck: Normal range of motion. Neck supple. No tracheal deviation present. No thyromegaly present.  Cardiovascular: Normal rate, regular rhythm, normal heart sounds and intact distal pulses.  Exam reveals no gallop and no friction rub.   No murmur heard. Respiratory: Effort normal and breath sounds normal. No respiratory distress. She has no wheezes. She has no rales. She exhibits no tenderness.  GI: Soft. Bowel sounds are normal. She exhibits no distension and no mass. There is no tenderness. There is no rebound and no guarding.  Genitourinary:  Breasts no masses skin changes or nipple changes bilaterally      Vulva is normal without lesions Vagina is pink moist without discharge Cervix absent Uterus is absent Adnexa is negative with normal sized ovaries   Musculoskeletal: Normal range of motion. She exhibits no edema and no tenderness.  Neurological: She is alert and oriented to person, place, and time. She has normal reflexes. She displays normal reflexes. No cranial nerve deficit. She exhibits normal muscle tone. Coordination normal.  Skin: Skin is warm and dry. No rash noted. No erythema. No pallor.  Psychiatric: She has a normal mood and affect. Her behavior is normal. Judgment and thought content normal.       Medications Ordered at today's visit: Meds ordered this encounter  Medications  . busPIRone (BUSPAR) 10 MG tablet    Sig: Take 10 mg by mouth 2 (two) times daily.   . butalbital-acetaminophen-caffeine (FIORICET) 50-325-40 MG tablet    Sig: Take 1-2 tablets by mouth every 6 (six) hours as needed for headache.    Dispense:  30 tablet    Refill:  0    Other orders placed at today's visit: No orders of the defined types were placed in this encounter.     Assessment:    Healthy female exam.    Plan:    discussed issues of sexual function in detail Pt to try suggestions and follow up prn     Return if symptoms worsen or fail to improve, for Follow up, with Dr Elonda Husky.

## 2016-09-04 DIAGNOSIS — Z1389 Encounter for screening for other disorder: Secondary | ICD-10-CM | POA: Diagnosis not present

## 2016-09-04 DIAGNOSIS — J209 Acute bronchitis, unspecified: Secondary | ICD-10-CM | POA: Diagnosis not present

## 2016-09-04 DIAGNOSIS — K219 Gastro-esophageal reflux disease without esophagitis: Secondary | ICD-10-CM | POA: Diagnosis not present

## 2016-09-04 DIAGNOSIS — J343 Hypertrophy of nasal turbinates: Secondary | ICD-10-CM | POA: Diagnosis not present

## 2016-09-04 DIAGNOSIS — Z6825 Body mass index (BMI) 25.0-25.9, adult: Secondary | ICD-10-CM | POA: Diagnosis not present

## 2016-09-04 DIAGNOSIS — J069 Acute upper respiratory infection, unspecified: Secondary | ICD-10-CM | POA: Diagnosis not present

## 2016-09-15 DIAGNOSIS — Z85820 Personal history of malignant melanoma of skin: Secondary | ICD-10-CM | POA: Diagnosis not present

## 2016-09-15 DIAGNOSIS — L570 Actinic keratosis: Secondary | ICD-10-CM | POA: Diagnosis not present

## 2016-10-17 DIAGNOSIS — E663 Overweight: Secondary | ICD-10-CM | POA: Diagnosis not present

## 2016-10-17 DIAGNOSIS — R51 Headache: Secondary | ICD-10-CM | POA: Diagnosis not present

## 2016-10-17 DIAGNOSIS — Z6825 Body mass index (BMI) 25.0-25.9, adult: Secondary | ICD-10-CM | POA: Diagnosis not present

## 2016-10-17 DIAGNOSIS — Z1389 Encounter for screening for other disorder: Secondary | ICD-10-CM | POA: Diagnosis not present

## 2016-10-31 ENCOUNTER — Other Ambulatory Visit: Payer: Self-pay | Admitting: Obstetrics & Gynecology

## 2016-12-01 ENCOUNTER — Telehealth: Payer: Self-pay | Admitting: Obstetrics & Gynecology

## 2016-12-01 ENCOUNTER — Other Ambulatory Visit: Payer: Self-pay | Admitting: *Deleted

## 2016-12-01 NOTE — Telephone Encounter (Signed)
Pt called stating that she is having issues with hemorrhoids, pt states that he has given her medication for it before and she would like for him to re-perscribe it. Please contact pt

## 2016-12-02 ENCOUNTER — Other Ambulatory Visit: Payer: Self-pay | Admitting: Obstetrics & Gynecology

## 2016-12-02 MED ORDER — ANALPRAM E 2.5-1 & 1 % RE KIT: 1.0000 | PACK | Freq: Three times a day (TID) | RECTAL | 11 refills | Status: DC

## 2016-12-02 NOTE — Telephone Encounter (Signed)
Informed patient prescription was sent to pharmacy. 

## 2016-12-02 NOTE — Telephone Encounter (Signed)
Patient called stating she would like a refill on the "hemorrhoid pills" you prescribed before. Please advise.

## 2016-12-03 ENCOUNTER — Telehealth: Payer: Self-pay | Admitting: *Deleted

## 2016-12-03 NOTE — Telephone Encounter (Signed)
Patient's insurance does not cover cream prescribed but will cover Proctozone Hc 2.5% cream. Please advise.

## 2016-12-04 ENCOUNTER — Other Ambulatory Visit: Payer: Self-pay | Admitting: Obstetrics & Gynecology

## 2016-12-04 ENCOUNTER — Telehealth: Payer: Self-pay | Admitting: *Deleted

## 2016-12-04 MED ORDER — HYDROCORTISONE 2.5 % RE CREA: 1.0000 "application " | TOPICAL_CREAM | Freq: Two times a day (BID) | RECTAL | 0 refills | Status: DC

## 2016-12-04 NOTE — Telephone Encounter (Signed)
Informed patient that prescription was changed and sent to pharmacy.

## 2016-12-18 ENCOUNTER — Ambulatory Visit (INDEPENDENT_AMBULATORY_CARE_PROVIDER_SITE_OTHER): Payer: BLUE CROSS/BLUE SHIELD | Admitting: Obstetrics & Gynecology

## 2016-12-18 ENCOUNTER — Encounter: Payer: Self-pay | Admitting: Obstetrics & Gynecology

## 2016-12-18 VITALS — BP 96/60 | HR 78 | Wt 145.0 lb

## 2016-12-18 DIAGNOSIS — G44209 Tension-type headache, unspecified, not intractable: Secondary | ICD-10-CM

## 2016-12-18 MED ORDER — BUTALBITAL-APAP-CAFFEINE 50-325-40 MG PO TABS: 1.0000 | ORAL_TABLET | Freq: Four times a day (QID) | ORAL | 0 refills | Status: AC | PRN

## 2016-12-18 NOTE — Progress Notes (Signed)
Chief Complaint  Patient presents with  . Headache    also wide range body pain    Blood pressure 96/60, pulse 78, weight 145 lb (65.8 kg), last menstrual period 04/18/2012.  31 y.o. No obstetric history on file. Patient's last menstrual period was 04/18/2012. The current method of family planning is status post hysterectomy.  Outpatient Encounter Prescriptions as of 12/18/2016  Medication Sig  . busPIRone (BUSPAR) 10 MG tablet Take 10 mg by mouth 2 (two) times daily.  . butalbital-acetaminophen-caffeine (FIORICET) 50-325-40 MG tablet Take 1-2 tablets by mouth every 6 (six) hours as needed for headache.  . butalbital-acetaminophen-caffeine (FIORICET, ESGIC) 50-325-40 MG tablet Take 1-2 tablets by mouth every 6 (six) hours as needed for headache.  . hydrocortisone (ANUSOL-HC) 2.5 % rectal cream Place 1 application rectally 2 (two) times daily. (Patient not taking: Reported on 12/18/2016)  . Hydrocortisone Ace-Pramoxine (ANALPRAM E) 2.5-1 & 1 % KIT Place 1 applicator rectally 3 (three) times daily. (Patient not taking: Reported on 12/18/2016)  . Meth-Hyo-M Bl-Na Phos-Ph Sal (URIBEL) 118 MG CAPS TAKE 1 CAPSULE BY MOUTH FOUR TIMES DAILY. (Patient not taking: Reported on 12/18/2016)  . URELLE (URELLE/URISED) 81 MG TABS tablet Take 1 tablet (81 mg total) by mouth 4 (four) times daily. (Patient not taking: Reported on 12/18/2016)   No facility-administered encounter medications on file as of 12/18/2016.     Subjective Pt does have history of vascular spasm headaches but these are different Mostly bioccipital also occasionally bifrontal pounding quality Daily, wakes up with them many days Has been going on for some months No nausea or photophobia No neurological symptoms  Objective +triggers in cervical trapezius bilaterally  +triggers bilateral trapezius occipital insertion +response to accupressure Poor ROM of neck in general Posture forward displacement of head and neck   Pertinent  ROS No burning with urination, frequency or urgency No nausea, vomiting or diarrhea Nor fever chills or other constitutional symptoms   Labs or studies     Impression Diagnoses this Encounter::   ICD-9-CM ICD-10-CM   1. Muscle contraction headache 307.81 G44.209     Established relevant diagnosis(es):   Plan/Recommendations: Meds ordered this encounter  Medications  . butalbital-acetaminophen-caffeine (FIORICET, ESGIC) 50-325-40 MG tablet    Sig: Take 1-2 tablets by mouth every 6 (six) hours as needed for headache.    Dispense:  20 tablet    Refill:  0    Labs or Scans Ordered: No orders of the defined types were placed in this encounter.   Management:: Recommend new memory gel pillow Instructed in self accupressure fioricet prn See chiropractor for long term therapy and management  Follow up Return if symptoms worsen or fail to improve.        All questions were answered.  Past Medical History:  Diagnosis Date  . Anxiety   . Asthma   . Interstitial cystitis   . Melanoma ([place])   . Melanoma [facility])     Past Surgical History:  Procedure Laterality Date  . NORPLANT REMOVAL  04/28/2012   Procedure: REMOVAL OF NORPLANT;  Surgeon: Florian Buff, MD;  Location: [facility];  Service: Gynecology;  Laterality: N/A;  removal of implanon-started at 1604  . TONGUE SURGERY     as child  . VAGINAL HYSTERECTOMY  04/28/2012   Procedure: HYSTERECTOMY VAGINAL;  Surgeon: Florian Buff, MD;  Location: [facility];  Service: Gynecology;  Laterality: N/A;    OB History    No data available  Allergies  Allergen Reactions  . Corn-Containing Products Hives and Itching    REACTION: Sneezing    Social History   Social History  . Marital status: Legally Separated    Spouse name: N/A  . Number of children: N/A  . Years of education: N/A   Social History Main Topics  . Smoking status: Former Smoker    Packs/day: 0.25    Years: 1.50    Types: Cigarettes    Quit  date: 04/22/2003  . Smokeless tobacco: Never Used  . Alcohol use 0.0 oz/week     Comment: occassional  . Drug use: No     Comment: cocoaine- and ectasy 8 yrs ago  . Sexual activity: Yes    Birth control/ protection: Surgical   Other Topics Concern  . None   Social History Narrative  . None    Family History  Problem Relation Age of Onset  . Cancer Mother     melanoma  . Hypertension Mother   . Asthma Daughter   . Heart disease Maternal Grandfather   . Seizures Maternal Grandfather     mini  . Cancer Paternal Grandfather     prostate

## 2016-12-23 DIAGNOSIS — S134XXA Sprain of ligaments of cervical spine, initial encounter: Secondary | ICD-10-CM | POA: Diagnosis not present

## 2016-12-23 DIAGNOSIS — G441 Vascular headache, not elsewhere classified: Secondary | ICD-10-CM | POA: Diagnosis not present

## 2016-12-23 DIAGNOSIS — M546 Pain in thoracic spine: Secondary | ICD-10-CM | POA: Diagnosis not present

## 2016-12-26 DIAGNOSIS — G441 Vascular headache, not elsewhere classified: Secondary | ICD-10-CM | POA: Diagnosis not present

## 2016-12-26 DIAGNOSIS — S134XXA Sprain of ligaments of cervical spine, initial encounter: Secondary | ICD-10-CM | POA: Diagnosis not present

## 2016-12-26 DIAGNOSIS — M546 Pain in thoracic spine: Secondary | ICD-10-CM | POA: Diagnosis not present

## 2016-12-30 DIAGNOSIS — G441 Vascular headache, not elsewhere classified: Secondary | ICD-10-CM | POA: Diagnosis not present

## 2016-12-30 DIAGNOSIS — M546 Pain in thoracic spine: Secondary | ICD-10-CM | POA: Diagnosis not present

## 2016-12-30 DIAGNOSIS — S134XXA Sprain of ligaments of cervical spine, initial encounter: Secondary | ICD-10-CM | POA: Diagnosis not present

## 2017-01-12 DIAGNOSIS — S134XXA Sprain of ligaments of cervical spine, initial encounter: Secondary | ICD-10-CM | POA: Diagnosis not present

## 2017-01-12 DIAGNOSIS — G441 Vascular headache, not elsewhere classified: Secondary | ICD-10-CM | POA: Diagnosis not present

## 2017-01-12 DIAGNOSIS — M546 Pain in thoracic spine: Secondary | ICD-10-CM | POA: Diagnosis not present

## 2017-01-19 DIAGNOSIS — G441 Vascular headache, not elsewhere classified: Secondary | ICD-10-CM | POA: Diagnosis not present

## 2017-01-19 DIAGNOSIS — M546 Pain in thoracic spine: Secondary | ICD-10-CM | POA: Diagnosis not present

## 2017-01-19 DIAGNOSIS — S134XXA Sprain of ligaments of cervical spine, initial encounter: Secondary | ICD-10-CM | POA: Diagnosis not present

## 2017-01-23 DIAGNOSIS — G441 Vascular headache, not elsewhere classified: Secondary | ICD-10-CM | POA: Diagnosis not present

## 2017-01-23 DIAGNOSIS — S134XXA Sprain of ligaments of cervical spine, initial encounter: Secondary | ICD-10-CM | POA: Diagnosis not present

## 2017-01-23 DIAGNOSIS — M546 Pain in thoracic spine: Secondary | ICD-10-CM | POA: Diagnosis not present

## 2017-01-29 DIAGNOSIS — J069 Acute upper respiratory infection, unspecified: Secondary | ICD-10-CM | POA: Diagnosis not present

## 2017-01-29 DIAGNOSIS — R070 Pain in throat: Secondary | ICD-10-CM | POA: Diagnosis not present

## 2017-01-29 DIAGNOSIS — E669 Obesity, unspecified: Secondary | ICD-10-CM | POA: Diagnosis not present

## 2017-01-29 DIAGNOSIS — B349 Viral infection, unspecified: Secondary | ICD-10-CM | POA: Diagnosis not present

## 2017-01-29 DIAGNOSIS — J343 Hypertrophy of nasal turbinates: Secondary | ICD-10-CM | POA: Diagnosis not present

## 2017-01-29 DIAGNOSIS — Z6824 Body mass index (BMI) 24.0-24.9, adult: Secondary | ICD-10-CM | POA: Diagnosis not present

## 2017-01-29 DIAGNOSIS — J329 Chronic sinusitis, unspecified: Secondary | ICD-10-CM | POA: Diagnosis not present

## 2017-01-29 DIAGNOSIS — K219 Gastro-esophageal reflux disease without esophagitis: Secondary | ICD-10-CM | POA: Diagnosis not present

## 2017-01-29 DIAGNOSIS — R062 Wheezing: Secondary | ICD-10-CM | POA: Diagnosis not present

## 2017-01-29 DIAGNOSIS — J209 Acute bronchitis, unspecified: Secondary | ICD-10-CM | POA: Diagnosis not present

## 2017-01-29 DIAGNOSIS — Z1389 Encounter for screening for other disorder: Secondary | ICD-10-CM | POA: Diagnosis not present

## 2017-02-05 ENCOUNTER — Ambulatory Visit (HOSPITAL_COMMUNITY)
Admission: RE | Admit: 2017-02-05 | Discharge: 2017-02-05 | Disposition: A | Discharge status: Home / Self Care | Payer: BLUE CROSS/BLUE SHIELD | Source: Ambulatory Visit | Attending: Family Medicine | Admitting: Family Medicine

## 2017-02-05 ENCOUNTER — Other Ambulatory Visit (HOSPITAL_COMMUNITY): Payer: Self-pay | Admitting: Family Medicine

## 2017-02-05 DIAGNOSIS — R0602 Shortness of breath: Secondary | ICD-10-CM | POA: Diagnosis not present

## 2017-02-05 DIAGNOSIS — J209 Acute bronchitis, unspecified: Secondary | ICD-10-CM | POA: Diagnosis not present

## 2017-02-05 DIAGNOSIS — R05 Cough: Secondary | ICD-10-CM | POA: Diagnosis not present

## 2017-02-05 DIAGNOSIS — J45909 Unspecified asthma, uncomplicated: Secondary | ICD-10-CM | POA: Insufficient documentation

## 2017-02-05 DIAGNOSIS — Z6825 Body mass index (BMI) 25.0-25.9, adult: Secondary | ICD-10-CM | POA: Diagnosis not present

## 2017-02-05 DIAGNOSIS — R079 Chest pain, unspecified: Secondary | ICD-10-CM | POA: Diagnosis not present

## 2017-02-05 DIAGNOSIS — E663 Overweight: Secondary | ICD-10-CM | POA: Diagnosis not present

## 2017-02-05 DIAGNOSIS — Z1389 Encounter for screening for other disorder: Secondary | ICD-10-CM | POA: Diagnosis not present

## 2017-07-16 DIAGNOSIS — M546 Pain in thoracic spine: Secondary | ICD-10-CM | POA: Diagnosis not present

## 2017-07-16 DIAGNOSIS — S134XXA Sprain of ligaments of cervical spine, initial encounter: Secondary | ICD-10-CM | POA: Diagnosis not present

## 2017-07-16 DIAGNOSIS — G441 Vascular headache, not elsewhere classified: Secondary | ICD-10-CM | POA: Diagnosis not present

## 2017-08-06 DIAGNOSIS — S134XXA Sprain of ligaments of cervical spine, initial encounter: Secondary | ICD-10-CM | POA: Diagnosis not present

## 2017-08-06 DIAGNOSIS — M546 Pain in thoracic spine: Secondary | ICD-10-CM | POA: Diagnosis not present

## 2017-08-06 DIAGNOSIS — G441 Vascular headache, not elsewhere classified: Secondary | ICD-10-CM | POA: Diagnosis not present

## 2017-08-27 DIAGNOSIS — M546 Pain in thoracic spine: Secondary | ICD-10-CM | POA: Diagnosis not present

## 2017-08-27 DIAGNOSIS — S134XXA Sprain of ligaments of cervical spine, initial encounter: Secondary | ICD-10-CM | POA: Diagnosis not present

## 2017-08-27 DIAGNOSIS — G441 Vascular headache, not elsewhere classified: Secondary | ICD-10-CM | POA: Diagnosis not present

## 2017-09-15 DIAGNOSIS — D485 Neoplasm of uncertain behavior of skin: Secondary | ICD-10-CM | POA: Diagnosis not present

## 2017-09-15 DIAGNOSIS — D224 Melanocytic nevi of scalp and neck: Secondary | ICD-10-CM | POA: Diagnosis not present

## 2017-09-15 DIAGNOSIS — D225 Melanocytic nevi of trunk: Secondary | ICD-10-CM | POA: Diagnosis not present

## 2017-09-15 DIAGNOSIS — Z85828 Personal history of other malignant neoplasm of skin: Secondary | ICD-10-CM | POA: Diagnosis not present

## 2017-10-26 DIAGNOSIS — J029 Acute pharyngitis, unspecified: Secondary | ICD-10-CM | POA: Diagnosis not present

## 2017-10-26 DIAGNOSIS — Z1389 Encounter for screening for other disorder: Secondary | ICD-10-CM | POA: Diagnosis not present

## 2017-10-26 DIAGNOSIS — Z6826 Body mass index (BMI) 26.0-26.9, adult: Secondary | ICD-10-CM | POA: Diagnosis not present

## 2017-10-26 DIAGNOSIS — R52 Pain, unspecified: Secondary | ICD-10-CM | POA: Diagnosis not present

## 2017-10-26 DIAGNOSIS — J111 Influenza due to unidentified influenza virus with other respiratory manifestations: Secondary | ICD-10-CM | POA: Diagnosis not present

## 2017-10-26 DIAGNOSIS — E663 Overweight: Secondary | ICD-10-CM | POA: Diagnosis not present

## 2017-10-30 DIAGNOSIS — Z1389 Encounter for screening for other disorder: Secondary | ICD-10-CM | POA: Diagnosis not present

## 2017-10-30 DIAGNOSIS — J069 Acute upper respiratory infection, unspecified: Secondary | ICD-10-CM | POA: Diagnosis not present

## 2017-10-30 DIAGNOSIS — Z6826 Body mass index (BMI) 26.0-26.9, adult: Secondary | ICD-10-CM | POA: Diagnosis not present

## 2017-10-30 DIAGNOSIS — E663 Overweight: Secondary | ICD-10-CM | POA: Diagnosis not present

## 2017-11-11 ENCOUNTER — Other Ambulatory Visit (HOSPITAL_COMMUNITY): Payer: Self-pay | Admitting: Physician Assistant

## 2017-11-11 ENCOUNTER — Ambulatory Visit (HOSPITAL_COMMUNITY)
Admission: RE | Admit: 2017-11-11 | Discharge: 2017-11-11 | Disposition: A | Discharge status: Home / Self Care | Payer: BLUE CROSS/BLUE SHIELD | Source: Ambulatory Visit | Attending: Physician Assistant | Admitting: Physician Assistant

## 2017-11-11 DIAGNOSIS — Z6826 Body mass index (BMI) 26.0-26.9, adult: Secondary | ICD-10-CM | POA: Diagnosis not present

## 2017-11-11 DIAGNOSIS — R05 Cough: Secondary | ICD-10-CM | POA: Diagnosis not present

## 2017-11-11 DIAGNOSIS — J209 Acute bronchitis, unspecified: Secondary | ICD-10-CM | POA: Insufficient documentation

## 2017-11-11 DIAGNOSIS — R059 Cough, unspecified: Secondary | ICD-10-CM

## 2017-11-11 DIAGNOSIS — K219 Gastro-esophageal reflux disease without esophagitis: Secondary | ICD-10-CM | POA: Diagnosis not present

## 2017-11-11 DIAGNOSIS — Z1389 Encounter for screening for other disorder: Secondary | ICD-10-CM | POA: Diagnosis not present

## 2017-11-11 DIAGNOSIS — R0602 Shortness of breath: Secondary | ICD-10-CM | POA: Diagnosis not present

## 2018-02-03 DIAGNOSIS — R0789 Other chest pain: Secondary | ICD-10-CM | POA: Diagnosis not present

## 2018-02-03 DIAGNOSIS — Z6826 Body mass index (BMI) 26.0-26.9, adult: Secondary | ICD-10-CM | POA: Diagnosis not present

## 2018-02-03 DIAGNOSIS — Z1389 Encounter for screening for other disorder: Secondary | ICD-10-CM | POA: Diagnosis not present

## 2018-02-03 DIAGNOSIS — R091 Pleurisy: Secondary | ICD-10-CM | POA: Diagnosis not present

## 2018-02-03 DIAGNOSIS — M940 Chondrocostal junction syndrome [Tietze]: Secondary | ICD-10-CM | POA: Diagnosis not present

## 2018-03-30 IMAGING — DX DG CHEST 2V
2 series · 2 of 2 positions shown · non-contrast
Comparison: None.

CLINICAL DATA: Shortness of breath, cough.  Chest pain.

EXAM:
CHEST  2 VIEW

[chest pa]
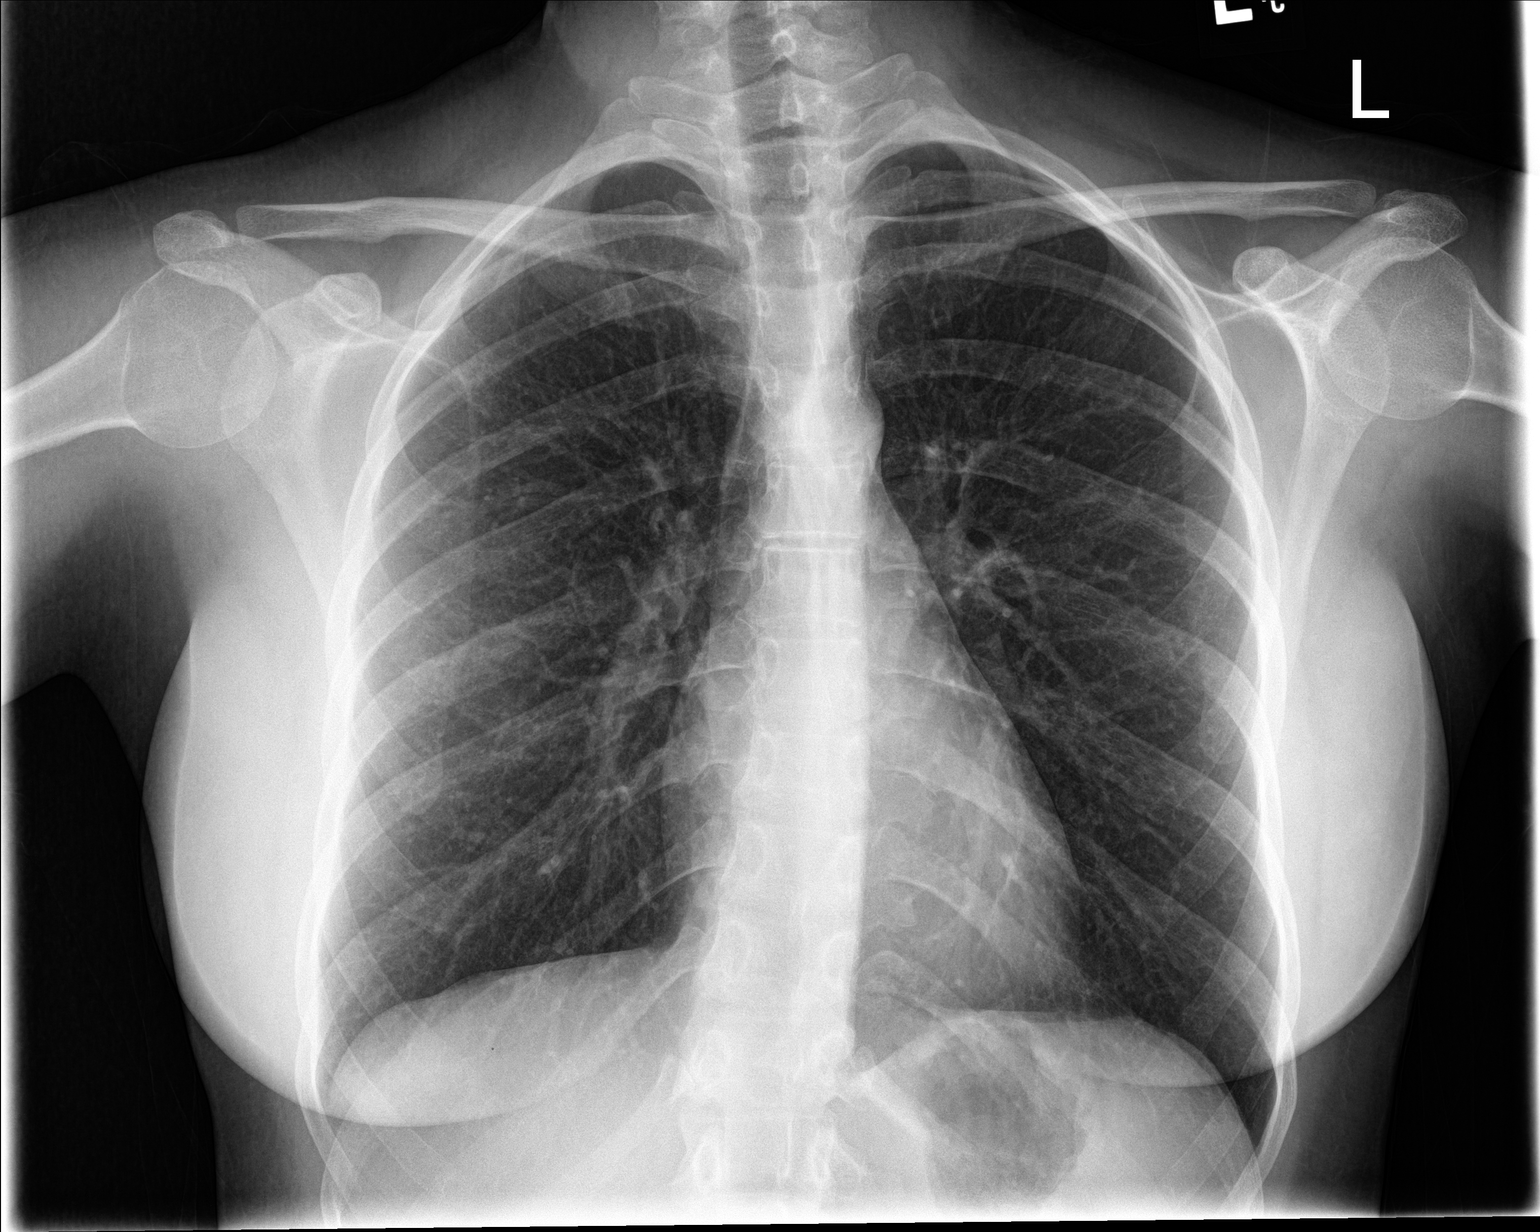

[chest lat]
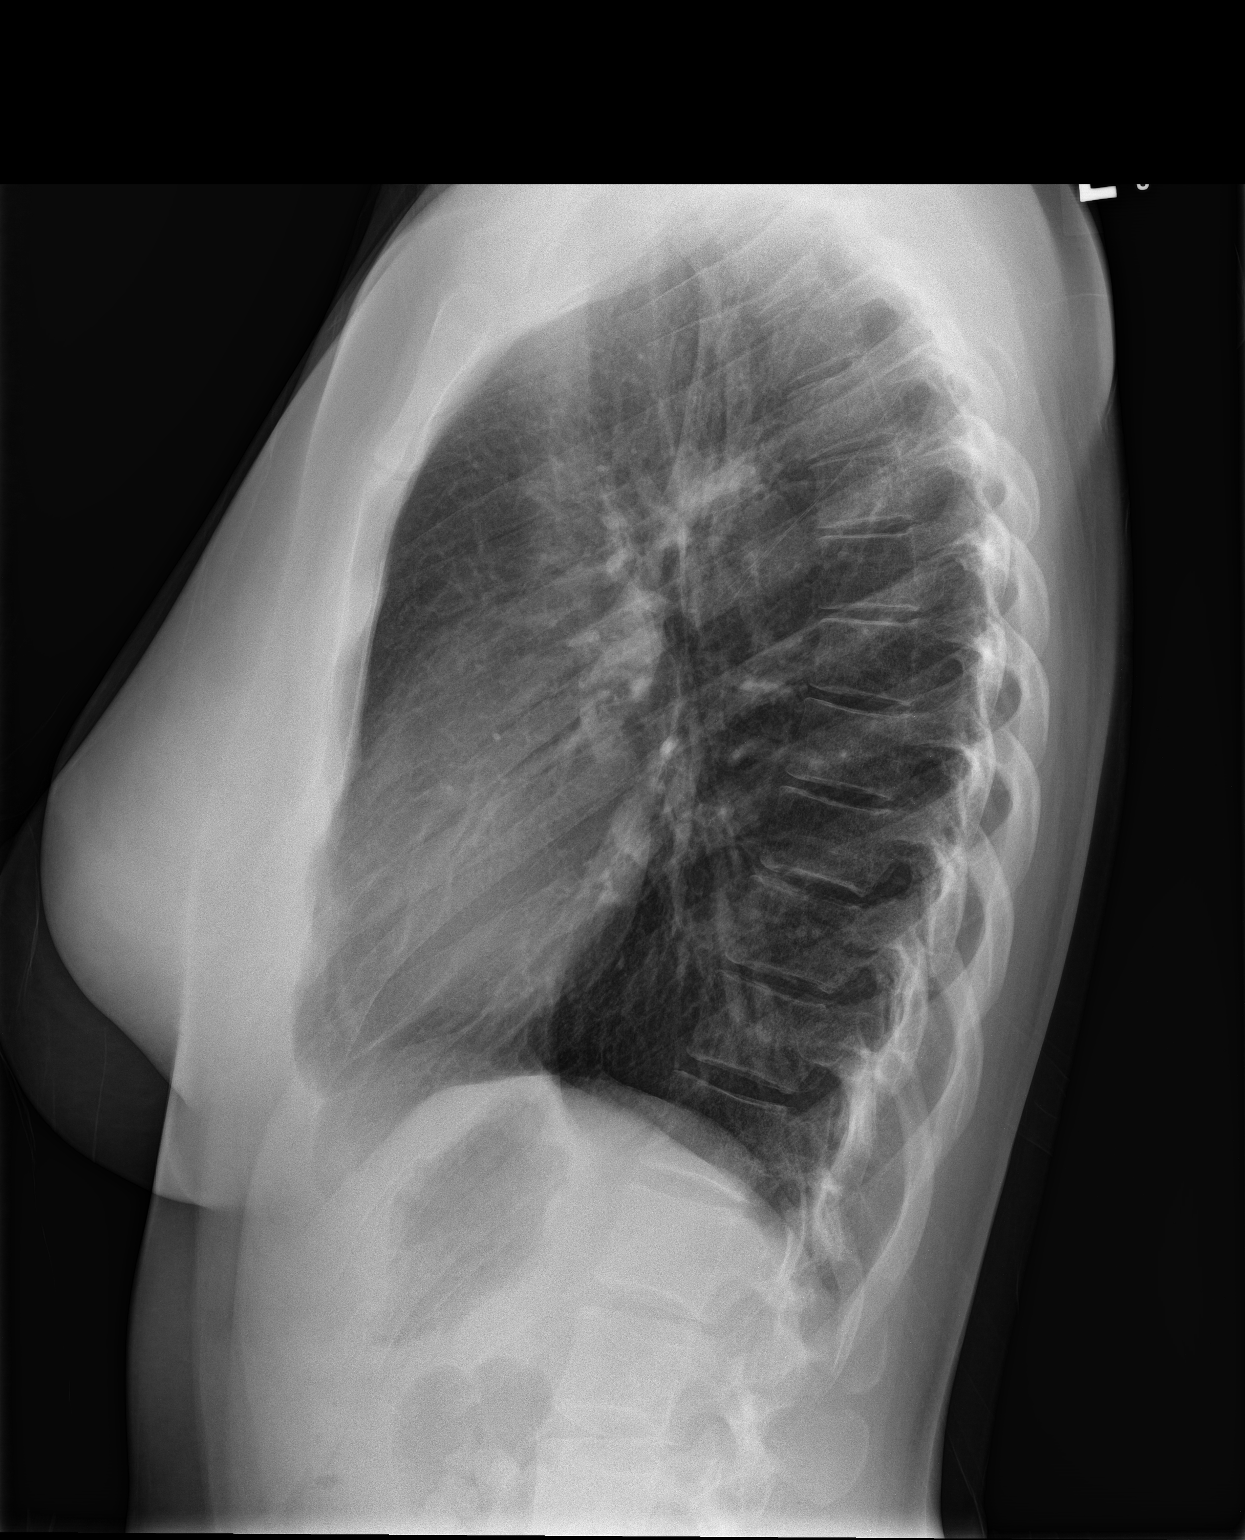

[2 of 2 positions shown; findings below may reference images not displayed]

FINDINGS: The heart size and mediastinal contours are within normal limits.
Both lungs are clear. No pneumothorax or pleural effusion is noted.
The visualized skeletal structures are unremarkable.
IMPRESSION: No active cardiopulmonary disease.

## 2018-08-02 DIAGNOSIS — Z1389 Encounter for screening for other disorder: Secondary | ICD-10-CM | POA: Diagnosis not present

## 2018-08-02 DIAGNOSIS — G5622 Lesion of ulnar nerve, left upper limb: Secondary | ICD-10-CM | POA: Diagnosis not present

## 2018-08-02 DIAGNOSIS — Z6827 Body mass index (BMI) 27.0-27.9, adult: Secondary | ICD-10-CM | POA: Diagnosis not present

## 2018-08-02 DIAGNOSIS — E663 Overweight: Secondary | ICD-10-CM | POA: Diagnosis not present

## 2018-08-02 DIAGNOSIS — M79672 Pain in left foot: Secondary | ICD-10-CM | POA: Diagnosis not present

## 2018-09-15 DIAGNOSIS — S338XXA Sprain of other parts of lumbar spine and pelvis, initial encounter: Secondary | ICD-10-CM | POA: Diagnosis not present

## 2018-09-15 DIAGNOSIS — M546 Pain in thoracic spine: Secondary | ICD-10-CM | POA: Diagnosis not present

## 2018-09-15 DIAGNOSIS — M531 Cervicobrachial syndrome: Secondary | ICD-10-CM | POA: Diagnosis not present

## 2018-09-15 DIAGNOSIS — S134XXA Sprain of ligaments of cervical spine, initial encounter: Secondary | ICD-10-CM | POA: Diagnosis not present

## 2018-09-22 DIAGNOSIS — M546 Pain in thoracic spine: Secondary | ICD-10-CM | POA: Diagnosis not present

## 2018-09-22 DIAGNOSIS — S338XXA Sprain of other parts of lumbar spine and pelvis, initial encounter: Secondary | ICD-10-CM | POA: Diagnosis not present

## 2018-09-22 DIAGNOSIS — S134XXA Sprain of ligaments of cervical spine, initial encounter: Secondary | ICD-10-CM | POA: Diagnosis not present

## 2018-09-22 DIAGNOSIS — M531 Cervicobrachial syndrome: Secondary | ICD-10-CM | POA: Diagnosis not present

## 2018-10-11 DIAGNOSIS — M546 Pain in thoracic spine: Secondary | ICD-10-CM | POA: Diagnosis not present

## 2018-10-11 DIAGNOSIS — M531 Cervicobrachial syndrome: Secondary | ICD-10-CM | POA: Diagnosis not present

## 2018-10-11 DIAGNOSIS — S134XXA Sprain of ligaments of cervical spine, initial encounter: Secondary | ICD-10-CM | POA: Diagnosis not present

## 2018-10-11 DIAGNOSIS — S338XXA Sprain of other parts of lumbar spine and pelvis, initial encounter: Secondary | ICD-10-CM | POA: Diagnosis not present

## 2018-10-25 DIAGNOSIS — M546 Pain in thoracic spine: Secondary | ICD-10-CM | POA: Diagnosis not present

## 2018-10-25 DIAGNOSIS — M531 Cervicobrachial syndrome: Secondary | ICD-10-CM | POA: Diagnosis not present

## 2018-10-25 DIAGNOSIS — S134XXA Sprain of ligaments of cervical spine, initial encounter: Secondary | ICD-10-CM | POA: Diagnosis not present

## 2018-10-25 DIAGNOSIS — S338XXA Sprain of other parts of lumbar spine and pelvis, initial encounter: Secondary | ICD-10-CM | POA: Diagnosis not present

## 2018-11-22 DIAGNOSIS — S338XXA Sprain of other parts of lumbar spine and pelvis, initial encounter: Secondary | ICD-10-CM | POA: Diagnosis not present

## 2018-11-22 DIAGNOSIS — S134XXA Sprain of ligaments of cervical spine, initial encounter: Secondary | ICD-10-CM | POA: Diagnosis not present

## 2018-11-22 DIAGNOSIS — M546 Pain in thoracic spine: Secondary | ICD-10-CM | POA: Diagnosis not present

## 2018-11-22 DIAGNOSIS — M531 Cervicobrachial syndrome: Secondary | ICD-10-CM | POA: Diagnosis not present

## 2018-12-07 DIAGNOSIS — D485 Neoplasm of uncertain behavior of skin: Secondary | ICD-10-CM | POA: Diagnosis not present

## 2018-12-07 DIAGNOSIS — D225 Melanocytic nevi of trunk: Secondary | ICD-10-CM | POA: Diagnosis not present

## 2018-12-21 DIAGNOSIS — S134XXA Sprain of ligaments of cervical spine, initial encounter: Secondary | ICD-10-CM | POA: Diagnosis not present

## 2018-12-21 DIAGNOSIS — M531 Cervicobrachial syndrome: Secondary | ICD-10-CM | POA: Diagnosis not present

## 2018-12-21 DIAGNOSIS — S338XXA Sprain of other parts of lumbar spine and pelvis, initial encounter: Secondary | ICD-10-CM | POA: Diagnosis not present

## 2018-12-21 DIAGNOSIS — M546 Pain in thoracic spine: Secondary | ICD-10-CM | POA: Diagnosis not present

## 2018-12-22 DIAGNOSIS — E663 Overweight: Secondary | ICD-10-CM | POA: Diagnosis not present

## 2018-12-22 DIAGNOSIS — Z1389 Encounter for screening for other disorder: Secondary | ICD-10-CM | POA: Diagnosis not present

## 2018-12-22 DIAGNOSIS — Z6826 Body mass index (BMI) 26.0-26.9, adult: Secondary | ICD-10-CM | POA: Diagnosis not present

## 2018-12-22 DIAGNOSIS — J343 Hypertrophy of nasal turbinates: Secondary | ICD-10-CM | POA: Diagnosis not present

## 2018-12-22 DIAGNOSIS — J029 Acute pharyngitis, unspecified: Secondary | ICD-10-CM | POA: Diagnosis not present

## 2018-12-22 DIAGNOSIS — R0981 Nasal congestion: Secondary | ICD-10-CM | POA: Diagnosis not present

## 2018-12-27 ENCOUNTER — Encounter: Payer: Self-pay | Admitting: Obstetrics & Gynecology

## 2018-12-27 ENCOUNTER — Ambulatory Visit (INDEPENDENT_AMBULATORY_CARE_PROVIDER_SITE_OTHER): Payer: BLUE CROSS/BLUE SHIELD | Admitting: Obstetrics & Gynecology

## 2018-12-27 VITALS — BP 119/78 | HR 78 | Ht 63.0 in | Wt 149.5 lb

## 2018-12-27 DIAGNOSIS — N6011 Diffuse cystic mastopathy of right breast: Secondary | ICD-10-CM | POA: Diagnosis not present

## 2018-12-27 NOTE — Progress Notes (Signed)
Chief Complaint  Patient presents with  . Breast Problem    right breast      33 y.o. G1P1001 Patient's last menstrual period was 04/18/2012. The current method of family planning is status post hysterectomy.  Outpatient Encounter Medications as of 12/27/2018  Medication Sig  . busPIRone (BUSPAR) 10 MG tablet Take 10 mg by mouth 2 (two) times daily.  . hydrocortisone (ANUSOL-HC) 2.5 % rectal cream Place 1 application rectally 2 (two) times daily. (Patient not taking: Reported on 12/18/2016)  . Hydrocortisone Ace-Pramoxine (ANALPRAM E) 2.5-1 & 1 % KIT Place 1 applicator rectally 3 (three) times daily. (Patient not taking: Reported on 12/18/2016)  . Meth-Hyo-M Bl-Na Phos-Ph Sal (URIBEL) 118 MG CAPS TAKE 1 CAPSULE BY MOUTH FOUR TIMES DAILY. (Patient not taking: Reported on 12/18/2016)  . URELLE (URELLE/URISED) 81 MG TABS tablet Take 1 tablet (81 mg total) by mouth 4 (four) times daily. (Patient not taking: Reported on 12/18/2016)   No facility-administered encounter medications on file as of 12/27/2018.     Subjective Pt's husband noticed an area in her right breast that he said "felt different" She is unsure of her ovarian cyclicity and she has done self exams and cannot feel it at all  She denies any skin changes or pain/tenderness He noticed it within the last 2 weeks and wanted her to get it checked out Past Medical History:  Diagnosis Date  . Anxiety   . Asthma   . Interstitial cystitis   . Melanoma ([place])   . Melanoma [facility])     Past Surgical History:  Procedure Laterality Date  . NORPLANT REMOVAL  04/28/2012   Procedure: REMOVAL OF NORPLANT;  Surgeon: Florian Buff, MD;  Location: [facility];  Service: Gynecology;  Laterality: N/A;  removal of implanon-started at 1604  . TONGUE SURGERY     as child  . VAGINAL HYSTERECTOMY  04/28/2012   Procedure: HYSTERECTOMY VAGINAL;  Surgeon: Florian Buff, MD;  Location: [facility];  Service: Gynecology;  Laterality: N/A;    OB History    Gravida  1   Para  1   Term  1   Preterm      AB      Living  1     SAB      TAB      Ectopic      Multiple      Live Births              Allergies  Allergen Reactions  . Corn-Containing Products Hives and Itching    REACTION: Sneezing    Social History   Socioeconomic History  . Marital status: Married    Spouse name: Not on file  . Number of children: Not on file  . Years of education: Not on file  . Highest education level: Not on file  Occupational History  . Not on file  Social Needs  . Financial resource strain: Not on file  . Food insecurity:    Worry: Not on file    Inability: Not on file  . Transportation needs:    Medical: Not on file    Non-medical: Not on file  Tobacco Use  . Smoking status: Former Smoker    Packs/day: 0.25    Years: 1.50    Pack years: 0.37    Types: Cigarettes    Last attempt to quit: 04/22/2003    Years since quitting: 15.6  . Smokeless tobacco: Never Used  Substance  and Sexual Activity  . Alcohol use: Yes    Alcohol/week: 0.0 standard drinks    Comment: occassional  . Drug use: No    Comment: cocoaine- and ectasy 8 yrs ago  . Sexual activity: Yes    Birth control/protection: Surgical  Lifestyle  . Physical activity:    Days per week: Not on file    Minutes per session: Not on file  . Stress: Not on file  Relationships  . Social connections:    Talks on phone: Not on file    Gets together: Not on file    Attends religious service: Not on file    Active member of club or organization: Not on file    Attends meetings of clubs or organizations: Not on file    Relationship status: Not on file  Other Topics Concern  . Not on file  Social History Narrative  . Not on file    Family History  Problem Relation Age of Onset  . Cancer Mother        melanoma  . Hypertension Mother   . Asthma Daughter   . Heart disease Maternal Grandfather   . Seizures Maternal Grandfather        mini  . Cancer Paternal  Grandfather        prostate     Medications:       Current Outpatient Medications:  .  busPIRone (BUSPAR) 10 MG tablet, Take 10 mg by mouth 2 (two) times daily., Disp: , Rfl:  .  hydrocortisone (ANUSOL-HC) 2.5 % rectal cream, Place 1 application rectally 2 (two) times daily. (Patient not taking: Reported on 12/18/2016), Disp: 30 g, Rfl: 0 .  Hydrocortisone Ace-Pramoxine (ANALPRAM E) 2.5-1 & 1 % KIT, Place 1 applicator rectally 3 (three) times daily. (Patient not taking: Reported on 12/18/2016), Disp: 1 kit, Rfl: 11 .  Meth-Hyo-M Bl-Na Phos-Ph Sal (URIBEL) 118 MG CAPS, TAKE 1 CAPSULE BY MOUTH FOUR TIMES DAILY. (Patient not taking: Reported on 12/18/2016), Disp: 120 capsule, Rfl: 11 .  URELLE (URELLE/URISED) 81 MG TABS tablet, Take 1 tablet (81 mg total) by mouth 4 (four) times daily. (Patient not taking: Reported on 12/18/2016), Disp: 120 each, Rfl: 4  Objective Blood pressure 119/78, pulse 78, height _0  (1.6 m), weight 149 lb 8 oz (67.8 kg), last menstrual period 04/18/2012.  Gen WDWN NAD Right breast without skin or nipple changes  No tenderness There is a wedge shaped area superior to the areolar which is a bit different consistent with fibrocystic changes but not a distinct mass or nodule is noted A little more sensitive for the patient on exam  Pertinent ROS No burning with urination, frequency or urgency No nausea, vomiting or diarrhea Nor fever chills or other constitutional symptoms   Labs or studies     Impression Diagnoses this Encounter::   ICD-10-CM   1. Fibrocystic breast changes, right N60.11     Established relevant diagnosis(es):   Plan/Recommendations: No orders of the defined types were placed in this encounter.   Labs or Scans Ordered: No orders of the defined types were placed in this encounter.   Management:: >pt already avoid caffein, normal fibrocystic  breast changes otherwise, if anything changes come back in for evaluation, no diagnostic  evaluation is needed at this point  Follow up Return if symptoms worsen or fail to improve.       All questions were answered.

## 2019-01-03 IMAGING — DX DG CHEST 2V
2 series · 2 of 2 positions shown · non-contrast
Comparison: 02/05/2017

CLINICAL DATA: Cough and shortness of breath for several weeks

EXAM:
CHEST  2 VIEW

[chest pa]
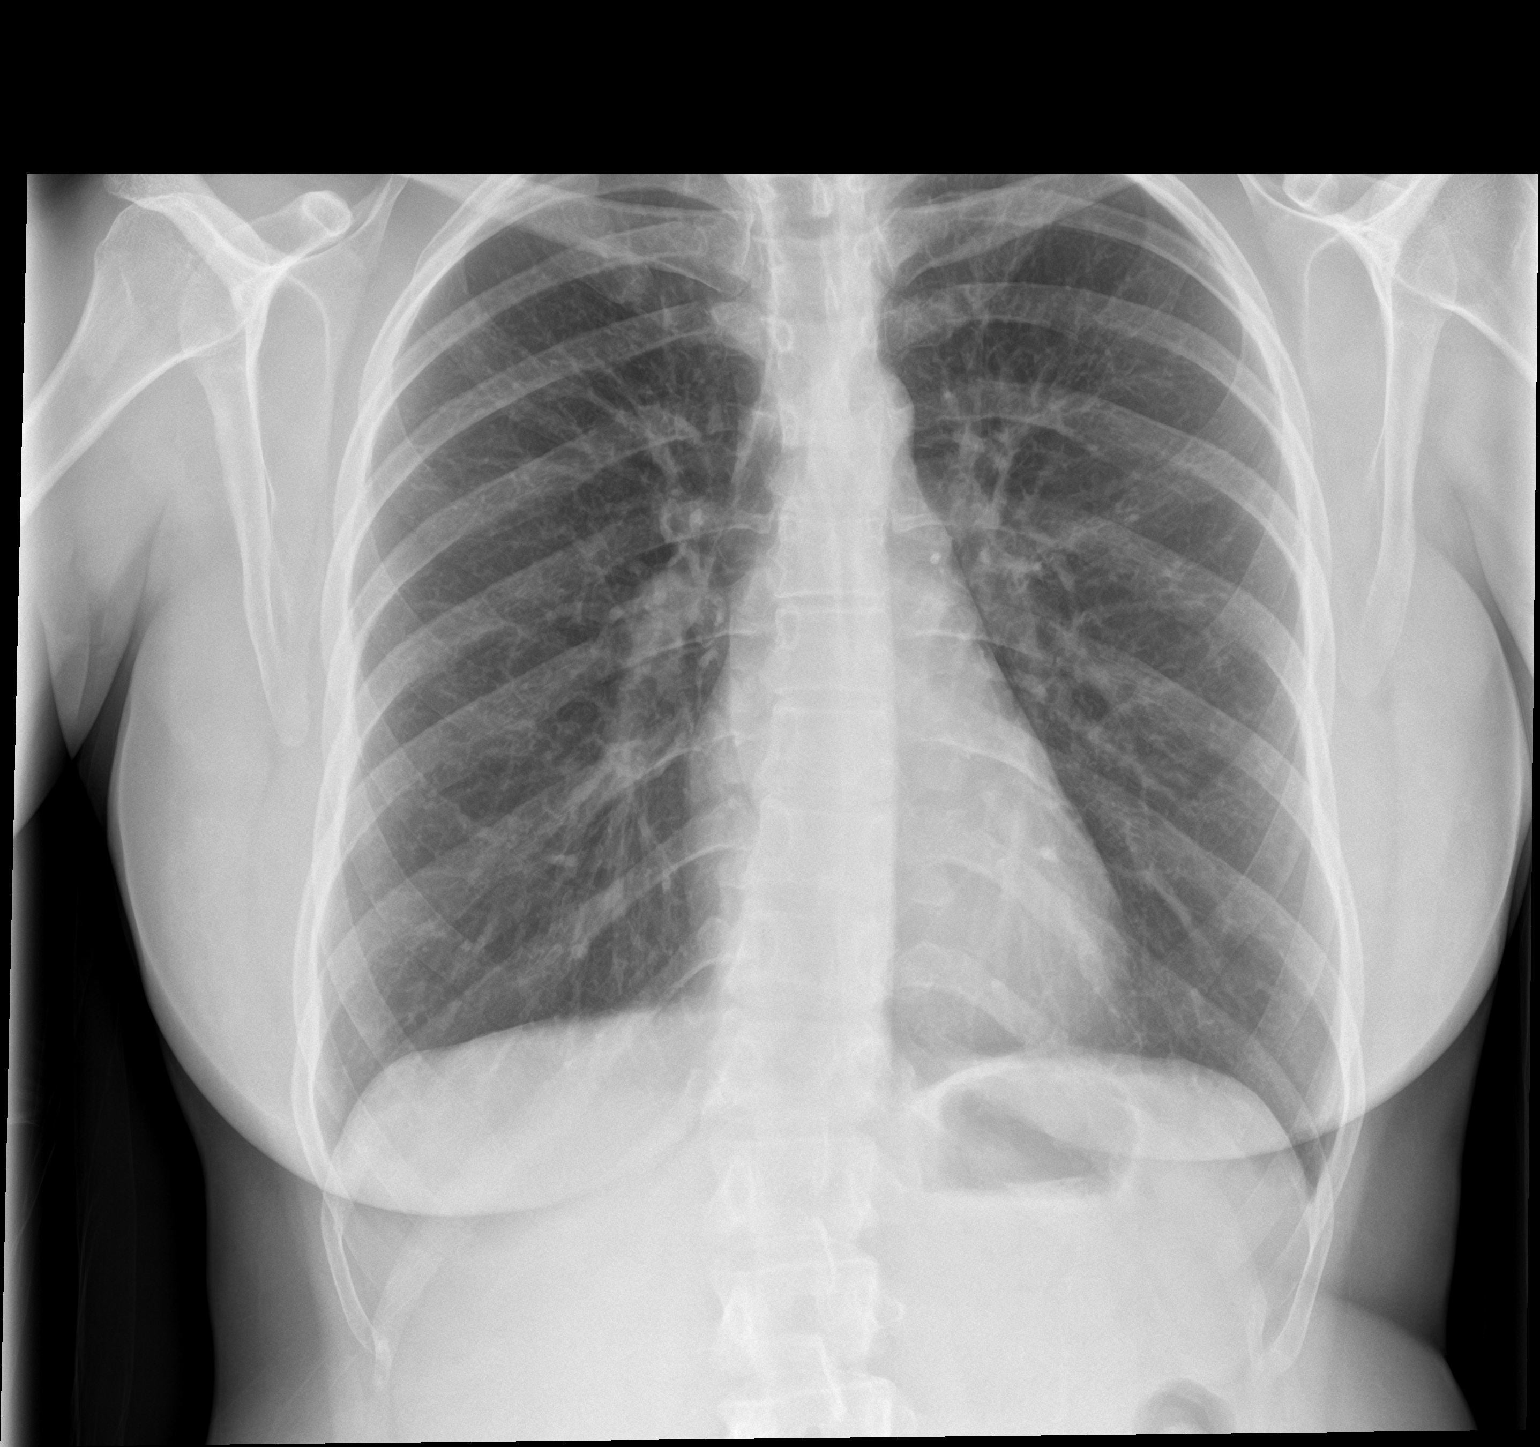

[chest lat]
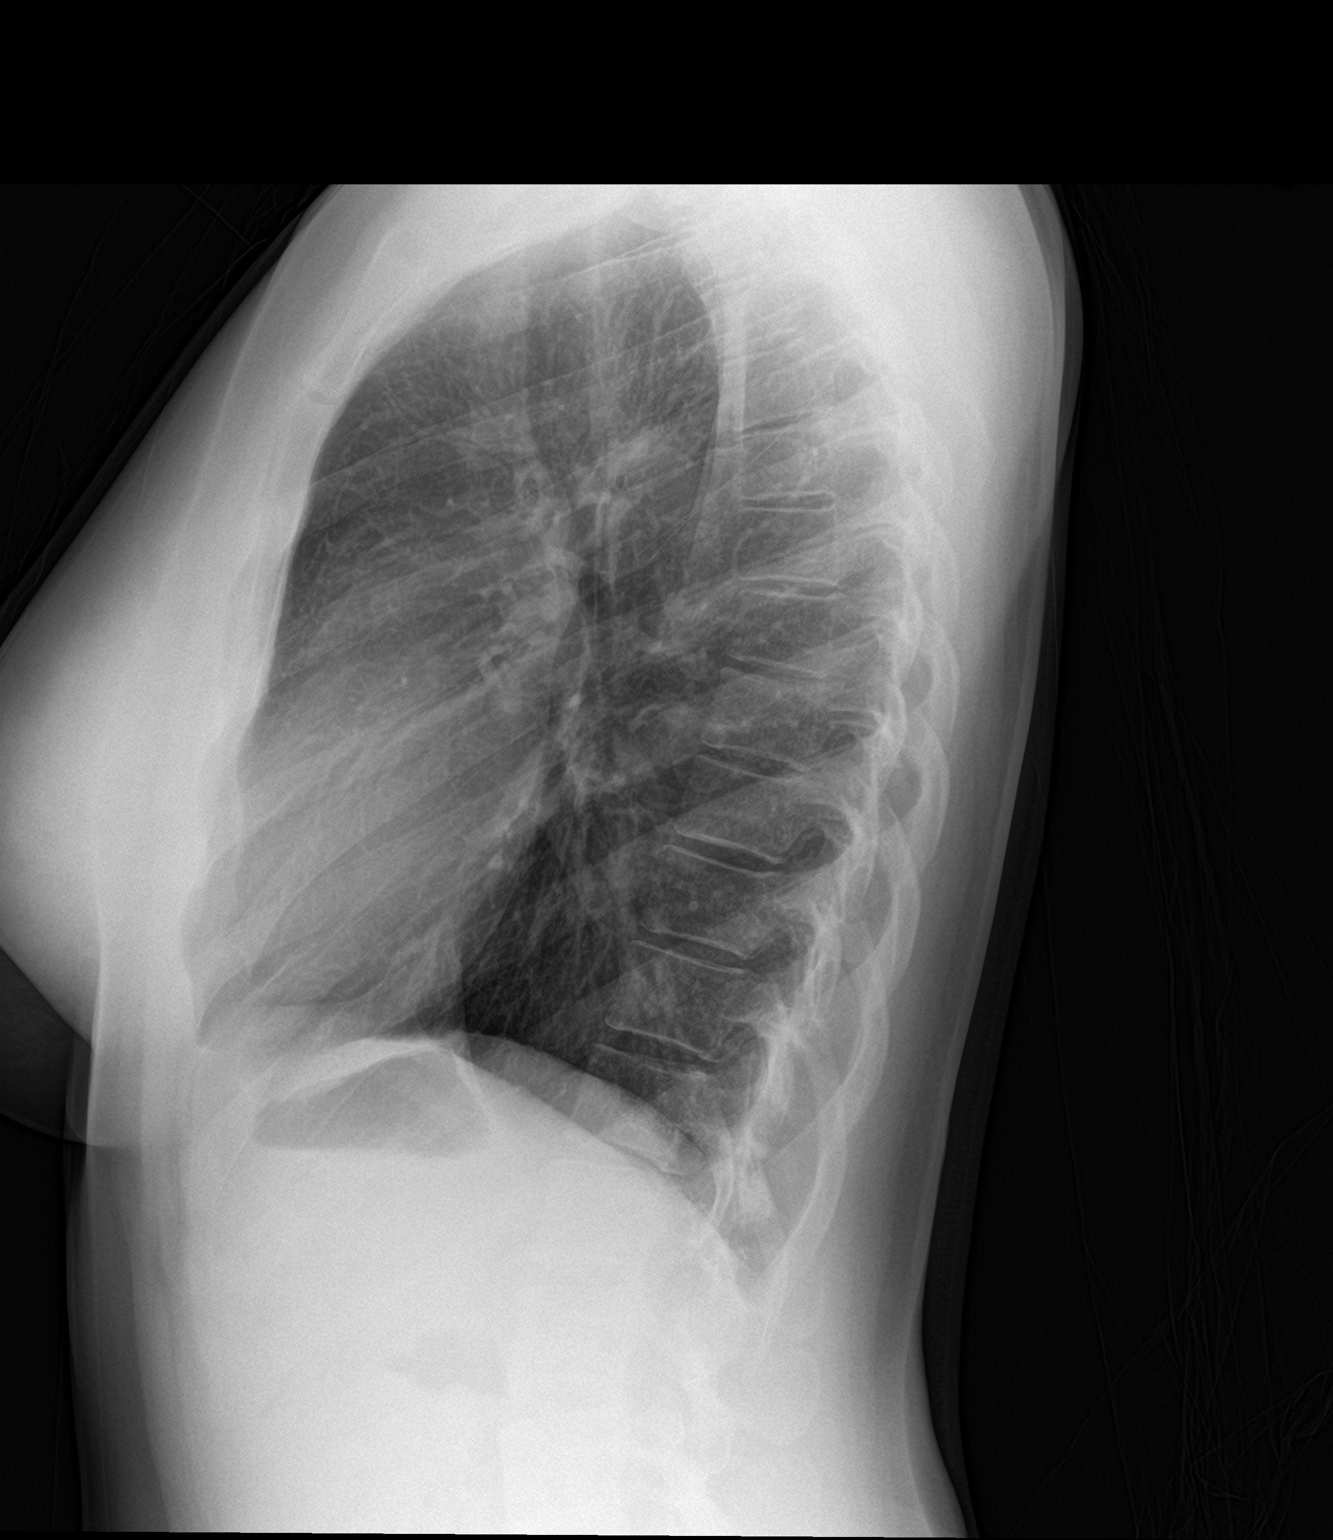

[2 of 2 positions shown; findings below may reference images not displayed]

FINDINGS: The heart size and mediastinal contours are within normal limits.
Both lungs are clear. The visualized skeletal structures are
unremarkable.
IMPRESSION: No active cardiopulmonary disease.

## 2019-02-02 DIAGNOSIS — R229 Localized swelling, mass and lump, unspecified: Secondary | ICD-10-CM | POA: Diagnosis not present

## 2019-04-12 DIAGNOSIS — S134XXA Sprain of ligaments of cervical spine, initial encounter: Secondary | ICD-10-CM | POA: Diagnosis not present

## 2019-04-12 DIAGNOSIS — M531 Cervicobrachial syndrome: Secondary | ICD-10-CM | POA: Diagnosis not present

## 2019-04-12 DIAGNOSIS — M546 Pain in thoracic spine: Secondary | ICD-10-CM | POA: Diagnosis not present

## 2019-04-12 DIAGNOSIS — S338XXA Sprain of other parts of lumbar spine and pelvis, initial encounter: Secondary | ICD-10-CM | POA: Diagnosis not present

## 2019-04-14 DIAGNOSIS — M531 Cervicobrachial syndrome: Secondary | ICD-10-CM | POA: Diagnosis not present

## 2019-04-14 DIAGNOSIS — S134XXA Sprain of ligaments of cervical spine, initial encounter: Secondary | ICD-10-CM | POA: Diagnosis not present

## 2019-04-14 DIAGNOSIS — M546 Pain in thoracic spine: Secondary | ICD-10-CM | POA: Diagnosis not present

## 2019-04-14 DIAGNOSIS — S338XXA Sprain of other parts of lumbar spine and pelvis, initial encounter: Secondary | ICD-10-CM | POA: Diagnosis not present

## 2019-07-19 DIAGNOSIS — M546 Pain in thoracic spine: Secondary | ICD-10-CM | POA: Diagnosis not present

## 2019-07-19 DIAGNOSIS — M531 Cervicobrachial syndrome: Secondary | ICD-10-CM | POA: Diagnosis not present

## 2019-07-19 DIAGNOSIS — S134XXA Sprain of ligaments of cervical spine, initial encounter: Secondary | ICD-10-CM | POA: Diagnosis not present

## 2019-07-19 DIAGNOSIS — S338XXA Sprain of other parts of lumbar spine and pelvis, initial encounter: Secondary | ICD-10-CM | POA: Diagnosis not present

## 2019-07-27 DIAGNOSIS — Z6826 Body mass index (BMI) 26.0-26.9, adult: Secondary | ICD-10-CM | POA: Diagnosis not present

## 2019-07-27 DIAGNOSIS — R0789 Other chest pain: Secondary | ICD-10-CM | POA: Diagnosis not present

## 2019-07-27 DIAGNOSIS — E663 Overweight: Secondary | ICD-10-CM | POA: Diagnosis not present

## 2019-08-02 DIAGNOSIS — M546 Pain in thoracic spine: Secondary | ICD-10-CM | POA: Diagnosis not present

## 2019-08-02 DIAGNOSIS — M531 Cervicobrachial syndrome: Secondary | ICD-10-CM | POA: Diagnosis not present

## 2019-08-02 DIAGNOSIS — S134XXA Sprain of ligaments of cervical spine, initial encounter: Secondary | ICD-10-CM | POA: Diagnosis not present

## 2019-08-02 DIAGNOSIS — S338XXA Sprain of other parts of lumbar spine and pelvis, initial encounter: Secondary | ICD-10-CM | POA: Diagnosis not present

## 2019-08-29 DIAGNOSIS — M546 Pain in thoracic spine: Secondary | ICD-10-CM | POA: Diagnosis not present

## 2019-08-29 DIAGNOSIS — S134XXA Sprain of ligaments of cervical spine, initial encounter: Secondary | ICD-10-CM | POA: Diagnosis not present

## 2019-08-29 DIAGNOSIS — S338XXA Sprain of other parts of lumbar spine and pelvis, initial encounter: Secondary | ICD-10-CM | POA: Diagnosis not present

## 2019-08-29 DIAGNOSIS — M531 Cervicobrachial syndrome: Secondary | ICD-10-CM | POA: Diagnosis not present

## 2019-09-08 ENCOUNTER — Ambulatory Visit: Payer: BLUE CROSS/BLUE SHIELD | Admitting: Cardiology

## 2019-09-08 ENCOUNTER — Encounter: Payer: Self-pay | Admitting: Cardiology

## 2019-09-08 ENCOUNTER — Other Ambulatory Visit: Payer: Self-pay

## 2019-09-08 VITALS — BP 113/69 | HR 72 | Temp 96.0°F | Ht 63.0 in | Wt 141.0 lb

## 2019-09-08 DIAGNOSIS — R002 Palpitations: Secondary | ICD-10-CM

## 2019-09-08 DIAGNOSIS — R0789 Other chest pain: Secondary | ICD-10-CM | POA: Diagnosis not present

## 2019-09-08 DIAGNOSIS — R079 Chest pain, unspecified: Secondary | ICD-10-CM

## 2019-09-08 NOTE — Patient Instructions (Signed)

## 2019-09-08 NOTE — Progress Notes (Signed)
Clinical Summary Sheri Wyatt is a 33 y.o.female seen as new consult, referred by Dr Hilma Favors for chest pain.   1. Atypical chest pain - mid to left chest, dull pain like a soreness. Mild pain. Can occur at rest or with exertion. Can have a feeling of having a hot flash, some nausea. Can last up to 1 hour, but typically 10 minutes. Not positional. Very sporadic, no clear frequency. Not related to food or eating    CAD risk factors: remote tobacco x 1 year   2. Palpitations - 1-2 beats that feel skipped, occurs weekly - no caffeine intake. Occasional EtOH.     SH: has a 25 yo daughter currently home schooling due to covid  Past Medical History:  Diagnosis Date  . Anxiety   . Asthma   . Interstitial cystitis   . Melanoma ([place])   . Melanoma ([place])      Allergies  Allergen Reactions  . Corn-Containing Products Hives and Itching    REACTION: Sneezing     Current Outpatient Medications  Medication Sig Dispense Refill  . busPIRone (BUSPAR) 10 MG tablet Take 10 mg by mouth 2 (two) times daily.    . hydrocortisone (ANUSOL-HC) 2.5 % rectal cream Place 1 application rectally 2 (two) times daily. (Patient not taking: Reported on 12/18/2016) 30 g 0  . Hydrocortisone Ace-Pramoxine (ANALPRAM E) 2.5-1 & 1 % KIT Place 1 applicator rectally 3 (three) times daily. (Patient not taking: Reported on 12/18/2016) 1 kit 11  . Meth-Hyo-M Bl-Na Phos-Ph Sal (URIBEL) 118 MG CAPS TAKE 1 CAPSULE BY MOUTH FOUR TIMES DAILY. (Patient not taking: Reported on 12/18/2016) 120 capsule 11  . URELLE (URELLE/URISED) 81 MG TABS tablet Take 1 tablet (81 mg total) by mouth 4 (four) times daily. (Patient not taking: Reported on 12/18/2016) 120 each 4   No current facility-administered medications for this visit.      Past Surgical History:  Procedure Laterality Date  . NORPLANT REMOVAL  04/28/2012   Procedure: REMOVAL OF NORPLANT;  Surgeon: Florian Buff, MD;  Location: [facility];  Service: Gynecology;  Laterality:  N/A;  removal of implanon-started at 1604  . TONGUE SURGERY     as child  . VAGINAL HYSTERECTOMY  04/28/2012   Procedure: HYSTERECTOMY VAGINAL;  Surgeon: Florian Buff, MD;  Location: [facility];  Service: Gynecology;  Laterality: N/A;     Allergies  Allergen Reactions  . Corn-Containing Products Hives and Itching    REACTION: Sneezing      Family History  Problem Relation Age of Onset  . Cancer Mother        melanoma  . Hypertension Mother   . Asthma Daughter   . Heart disease Maternal Grandfather   . Seizures Maternal Grandfather        mini  . Cancer Paternal Grandfather        prostate      Social History Sheri Wyatt reports that she quit smoking about 16 years ago. Her smoking use included cigarettes. She has a 0.38 pack-year smoking history. She has never used smokeless tobacco. Sheri Wyatt reports current alcohol use.   Review of Systems CONSTITUTIONAL: No weight loss, fever, chills, weakness or fatigue.  HEENT: Eyes: No visual loss, blurred vision, double vision or yellow sclerae.No hearing loss, sneezing, congestion, runny nose or sore throat.  SKIN: No rash or itching.  CARDIOVASCULAR: per hpi RESPIRATORY: No shortness of breath, cough or sputum.  GASTROINTESTINAL: No anorexia, nausea, vomiting or diarrhea.  No abdominal pain or blood.  GENITOURINARY: No burning on urination, no polyuria NEUROLOGICAL: No headache, dizziness, syncope, paralysis, ataxia, numbness or tingling in the extremities. No change in bowel or bladder control.  MUSCULOSKELETAL: No muscle, back pain, joint pain or stiffness.  LYMPHATICS: No enlarged nodes. No history of splenectomy.  PSYCHIATRIC: No history of depression or anxiety.  ENDOCRINOLOGIC: No reports of sweating, cold or heat intolerance. No polyuria or polydipsia.  Marland Kitchen   Physical Examination Today's Vitals   09/08/19 1015  BP: 113/69  Pulse: 72  Temp: (!) 96 F (35.6 C)  TempSrc: Temporal  SpO2: 99%  Weight: 141 lb (64 kg)   Height: _0  (1.6 m)   Body mass index is 24.98 kg/m.  Gen: resting comfortably, no acute distress HEENT: no scleral icterus, pupils equal round and reactive, no palptable cervical adenopathy,  CV: RRR, no m/r.g, no jvd Resp: Clear to auscultation bilaterally GI: abdomen is soft, non-tender, non-distended, normal bowel sounds, no hepatosplenomegaly MSK: extremities are warm, no edema.  Skin: warm, no rash Neuro:  no focal deficits Psych: appropriate affect    Assessment and Plan  1. Chest pain - fairly atypical symptoms in young patient without significant CAD risk factors - EKG shows NSR, isoalted TWI in III and V1 nonspecific - will get an echo to evaluate for any underlying structural heart disease, would not pursue ischemic testing at this time.   2. Palpitations - mild and infrequent, if progress would consider event monitor      Arnoldo Lenis, M.D.

## 2019-09-14 ENCOUNTER — Ambulatory Visit (HOSPITAL_COMMUNITY)
Admission: RE | Admit: 2019-09-14 | Discharge: 2019-09-14 | Disposition: A | Discharge status: Home / Self Care | Payer: BC Managed Care – PPO | Source: Ambulatory Visit | Attending: Cardiology | Admitting: Cardiology

## 2019-09-14 ENCOUNTER — Other Ambulatory Visit: Payer: Self-pay

## 2019-09-14 ENCOUNTER — Telehealth: Payer: Self-pay

## 2019-09-14 DIAGNOSIS — Z6826 Body mass index (BMI) 26.0-26.9, adult: Secondary | ICD-10-CM | POA: Diagnosis not present

## 2019-09-14 DIAGNOSIS — J22 Unspecified acute lower respiratory infection: Secondary | ICD-10-CM | POA: Diagnosis not present

## 2019-09-14 DIAGNOSIS — R079 Chest pain, unspecified: Secondary | ICD-10-CM | POA: Insufficient documentation

## 2019-09-14 DIAGNOSIS — E663 Overweight: Secondary | ICD-10-CM | POA: Diagnosis not present

## 2019-09-14 DIAGNOSIS — J209 Acute bronchitis, unspecified: Secondary | ICD-10-CM | POA: Diagnosis not present

## 2019-09-14 NOTE — Progress Notes (Signed)
*  PRELIMINARY RESULTS* Echocardiogram 2D Echocardiogram has been performed.  Samuel Germany 09/14/2019, 10:05 AM

## 2019-09-14 NOTE — Telephone Encounter (Signed)
-----   Message from Herminio Commons, MD sent at 09/14/2019 10:55 AM EST ----- Normal cardiac function.

## 2019-09-14 NOTE — Telephone Encounter (Signed)
Called pt. No answer. Left message for pt to return call.  

## 2019-09-19 DIAGNOSIS — S338XXA Sprain of other parts of lumbar spine and pelvis, initial encounter: Secondary | ICD-10-CM | POA: Diagnosis not present

## 2019-09-19 DIAGNOSIS — S134XXA Sprain of ligaments of cervical spine, initial encounter: Secondary | ICD-10-CM | POA: Diagnosis not present

## 2019-09-19 DIAGNOSIS — M546 Pain in thoracic spine: Secondary | ICD-10-CM | POA: Diagnosis not present

## 2019-09-19 DIAGNOSIS — M531 Cervicobrachial syndrome: Secondary | ICD-10-CM | POA: Diagnosis not present

## 2019-09-23 DIAGNOSIS — Z20828 Contact with and (suspected) exposure to other viral communicable diseases: Secondary | ICD-10-CM | POA: Diagnosis not present

## 2019-09-23 DIAGNOSIS — Z6824 Body mass index (BMI) 24.0-24.9, adult: Secondary | ICD-10-CM | POA: Diagnosis not present

## 2019-09-23 DIAGNOSIS — J45909 Unspecified asthma, uncomplicated: Secondary | ICD-10-CM | POA: Diagnosis not present

## 2019-12-25 ENCOUNTER — Ambulatory Visit: Payer: Self-pay | Attending: Internal Medicine

## 2019-12-25 DIAGNOSIS — Z23 Encounter for immunization: Secondary | ICD-10-CM | POA: Insufficient documentation

## 2019-12-25 NOTE — Progress Notes (Signed)
   Covid-19 Vaccination Clinic  Name:  Sheri Wyatt    MRN: [id number] DOB: 1986/02/27  12/25/2019  [name] was observed post Covid-19 immunization for 30 minutes based on pre-vaccination screening without incident. She was provided with Vaccine Information Sheet and instruction to access the V-Safe system.   [name] was instructed to call 911 with any severe reactions post vaccine: Marland Kitchen Difficulty breathing  . Swelling of face and throat  . A fast heartbeat  . A bad rash all over body  . Dizziness and weakness   Immunizations Administered    Name Date Dose VIS Date Route   Pfizer COVID-19 Vaccine 12/25/2019  7:39 PM 0.3 mL 09/30/2019 Intramuscular   Manufacturer: [place]   Lot: [id number]   [place]: [id number]

## 2020-01-15 ENCOUNTER — Ambulatory Visit: Payer: Self-pay | Attending: Internal Medicine

## 2020-01-15 DIAGNOSIS — Z23 Encounter for immunization: Secondary | ICD-10-CM

## 2020-01-15 NOTE — Progress Notes (Signed)
   Covid-19 Vaccination Clinic  Name:  Sheri Wyatt    MRN: [id number] DOB: [birth year]  01/15/2020  [name] was observed post Covid-19 immunization for 30 minutes based on pre-vaccination screening without incident. She was provided with Vaccine Information Sheet and instruction to access the V-Safe system.   [name] was instructed to call 911 with any severe reactions post vaccine: Marland Kitchen Difficulty breathing  . Swelling of face and throat  . A fast heartbeat  . A bad rash all over body  . Dizziness and weakness   Immunizations Administered    Name Date Dose VIS Date Route   Pfizer COVID-19 Vaccine 01/15/2020  1:33 PM 0.3 mL 09/30/2019 Intramuscular   Manufacturer: [place]   Lot: [id number]   [place]: [id number]

## 2020-10-22 ENCOUNTER — Telehealth: Payer: Self-pay | Admitting: Obstetrics & Gynecology

## 2020-10-22 NOTE — Telephone Encounter (Signed)
Pt called with Dr. Forestine Chute advice to continue taking Urelle. She explained that she had taken an expired prescription and her PCP had renewed it this morning. She was instructed to take the new prescription as the expired loses its efficacy and is likely the reason it isn't working as well. She has an appt for Thursday, 1/13 and will call each day to see if she can get an earlier appt.

## 2020-10-22 NOTE — Telephone Encounter (Signed)
Returned pt's call to investigate symptoms and get a better understanding of her needs. Talked with Dr. Despina Hidden who will see her in the office when an appt is available.

## 2020-10-22 NOTE — Telephone Encounter (Signed)
In the meantime, have patient bring by a urine and do a urine culture as a nursing visit, we will have that as information prior to her appt which would be helpful

## 2020-10-22 NOTE — Telephone Encounter (Signed)
Patient wants to know if she can come in to be seen by Dr. Despina Hidden for possible Interstitial Cystitis. Clinical staff will follow up with patient.

## 2020-10-22 NOTE — Telephone Encounter (Signed)
There is not an alternative to urelle for IC or UTI related pain, urelle is the best there is

## 2020-10-30 ENCOUNTER — Encounter: Payer: Self-pay | Admitting: Obstetrics & Gynecology

## 2020-10-30 ENCOUNTER — Ambulatory Visit (INDEPENDENT_AMBULATORY_CARE_PROVIDER_SITE_OTHER): Payer: 59 | Admitting: Obstetrics & Gynecology

## 2020-10-30 ENCOUNTER — Other Ambulatory Visit: Payer: Self-pay

## 2020-10-30 VITALS — BP 103/68 | HR 80 | Temp 99.0°F | Ht 63.0 in | Wt 138.3 lb

## 2020-10-30 DIAGNOSIS — N3000 Acute cystitis without hematuria: Secondary | ICD-10-CM

## 2020-10-30 DIAGNOSIS — N3010 Interstitial cystitis (chronic) without hematuria: Secondary | ICD-10-CM | POA: Diagnosis not present

## 2020-10-30 MED ORDER — CIPROFLOXACIN HCL 500 MG PO TABS: 500.0000 mg | ORAL_TABLET | Freq: Two times a day (BID) | ORAL | 0 refills | Status: DC

## 2020-10-30 NOTE — Progress Notes (Signed)
Chief Complaint  Patient presents with  . interstitial cystitis    Feel pressure/ pain lower part stomach and frequent urination      35 y.o. G1P1001 Patient's last menstrual period was 04/18/2012. The current method of family planning is status post hysterectomy.  Outpatient Encounter Medications as of 10/30/2020  Medication Sig  . cetirizine (ZYRTEC) 10 MG chewable tablet Chew 10 mg by mouth daily.  . ciprofloxacin (CIPRO) 500 MG tablet Take 1 tablet (500 mg total) by mouth 2 (two) times daily.  . methylPREDNISolone (MEDROL) 4 MG tablet Take 4 mg by mouth daily.  Marland Kitchen URELLE (URELLE/URISED) 81 MG TABS tablet Take 1 tablet (81 mg total) by mouth 4 (four) times daily.   No facility-administered encounter medications on file as of 10/30/2020.    Subjective Pt has history of IC no problems for years and years Was concerned she had UTI by phone call Had urine culture at [facility] which is positive sensitivities done, sensitive to cipro and macrobid Past Medical History:  Diagnosis Date  . Anxiety   . Asthma   . Interstitial cystitis   . Melanoma ([place])   . Melanoma [facility])     Past Surgical History:  Procedure Laterality Date  . NORPLANT REMOVAL  04/28/2012   Procedure: REMOVAL OF NORPLANT;  Surgeon: Florian Buff, MD;  Location: [facility];  Service: Gynecology;  Laterality: N/A;  removal of implanon-started at 1604  . TONGUE SURGERY     as child  . VAGINAL HYSTERECTOMY  04/28/2012   Procedure: HYSTERECTOMY VAGINAL;  Surgeon: Florian Buff, MD;  Location: [facility];  Service: Gynecology;  Laterality: N/A;    OB History    Gravida  1   Para  1   Term  1   Preterm      AB      Living  1     SAB      IAB      Ectopic      Multiple      Live Births              Allergies  Allergen Reactions  . Corn-Containing Products Hives and Itching    REACTION: Sneezing    Social History   Socioeconomic History  . Marital status: Married    Spouse name: Not on  file  . Number of children: Not on file  . Years of education: Not on file  . Highest education level: Not on file  Occupational History  . Not on file  Tobacco Use  . Smoking status: Former Smoker    Packs/day: 0.25    Years: 1.50    Pack years: 0.37    Types: Cigarettes    Quit date: 04/22/2003    Years since quitting: 17.5  . Smokeless tobacco: Never Used  Vaping Use  . Vaping Use: Never used  Substance and Sexual Activity  . Alcohol use: Yes    Alcohol/week: 0.0 standard drinks    Comment: occassional  . Drug use: No    Comment: cocoaine- and ectasy 8 yrs ago  . Sexual activity: Yes    Birth control/protection: Surgical  Other Topics Concern  . Not on file  Social History Narrative  . Not on file   Social Determinants of Health   Financial Resource Strain: Not on file  Food Insecurity: Not on file  Transportation Needs: Not on file  Physical Activity: Not on file  Stress: Not on file  Social  Connections: Not on file    Family History  Problem Relation Age of Onset  . Cancer Mother        melanoma  . Hypertension Mother   . Asthma Daughter   . Heart disease Maternal Grandfather   . Seizures Maternal Grandfather        mini  . Cancer Paternal Grandfather        prostate     Medications:       Current Outpatient Medications:  .  cetirizine (ZYRTEC) 10 MG chewable tablet, Chew 10 mg by mouth daily., Disp: , Rfl:  .  ciprofloxacin (CIPRO) 500 MG tablet, Take 1 tablet (500 mg total) by mouth 2 (two) times daily., Disp: 14 tablet, Rfl: 0 .  methylPREDNISolone (MEDROL) 4 MG tablet, Take 4 mg by mouth daily., Disp: , Rfl:  .  URELLE (URELLE/URISED) 81 MG TABS tablet, Take 1 tablet (81 mg total) by mouth 4 (four) times daily., Disp: 120 each, Rfl: 4  Objective Blood pressure 103/68, pulse 80, temperature 99 F (37.2 C), height 5\' 3"  (1.6 m), weight 138 lb 4.8 oz (62.7 kg), last menstrual period 04/18/2012.  Gen WDWN NAD  Pertinent ROS No burning with  urination, frequency or urgency No nausea, vomiting or diarrhea Nor fever chills or other constitutional symptoms   Labs or studies Urine culture done at [facility], sensitivities are back and sens to cipro even though I don't have the specific bacteria    Impression Diagnoses this Encounter::   ICD-10-CM   1. Acute cystitis without hematuria  N30.00   2. IC (interstitial cystitis)  N30.10     Established relevant diagnosis(es):   Plan/Recommendations: Meds ordered this encounter  Medications  . ciprofloxacin (CIPRO) 500 MG tablet    Sig: Take 1 tablet (500 mg total) by mouth 2 (two) times daily.    Dispense:  14 tablet    Refill:  0    Labs or Scans Ordered: No orders of the defined types were placed in this encounter.   Management:: Will treat with cipro and have patient follow up with me via MyChart on Friday am and see how she is doing  Follow up Return if symptoms worsen or fail to improve.        Face to face time:  15 minutes  Greater than 50% of the visit time was spent in counseling and coordination of care with the patient.  The summary and outline of the counseling and care coordination is summarized in the note above.   All questions were answered.

## 2020-11-01 ENCOUNTER — Encounter: Payer: Self-pay | Admitting: Obstetrics & Gynecology

## 2021-04-27 ENCOUNTER — Ambulatory Visit
Admission: RE | Admit: 2021-04-27 | Discharge: 2021-04-27 | Disposition: A | Discharge status: Home / Self Care | Payer: 59 | Source: Ambulatory Visit | Attending: Family Medicine | Admitting: Family Medicine

## 2021-04-27 ENCOUNTER — Other Ambulatory Visit: Payer: Self-pay

## 2021-04-27 VITALS — BP 130/79 | HR 98 | Temp 98.7°F | Resp 16

## 2021-04-27 DIAGNOSIS — J209 Acute bronchitis, unspecified: Secondary | ICD-10-CM

## 2021-04-27 DIAGNOSIS — J029 Acute pharyngitis, unspecified: Secondary | ICD-10-CM

## 2021-04-27 DIAGNOSIS — H9203 Otalgia, bilateral: Secondary | ICD-10-CM

## 2021-04-27 DIAGNOSIS — J3089 Other allergic rhinitis: Secondary | ICD-10-CM

## 2021-04-27 MED ORDER — PREDNISONE 20 MG PO TABS: 20.0000 mg | ORAL_TABLET | Freq: Every day | ORAL | 0 refills | Status: AC

## 2021-04-27 MED ORDER — METHYLPREDNISOLONE SODIUM SUCC 125 MG IJ SOLR
125.0000 mg | Freq: Once | INTRAMUSCULAR | Status: AC
  Administered 2021-04-27: 125 mg via INTRAMUSCULAR

## 2021-04-27 MED ORDER — PSEUDOEPH-BROMPHEN-DM 30-2-10 MG/5ML PO SYRP: 5.0000 mL | ORAL_SOLUTION | Freq: Four times a day (QID) | ORAL | 0 refills | Status: DC | PRN

## 2021-04-27 MED ORDER — IPRATROPIUM-ALBUTEROL 0.5-2.5 (3) MG/3ML IN SOLN: 3.0000 mL | Freq: Four times a day (QID) | RESPIRATORY_TRACT | 0 refills | Status: DC | PRN

## 2021-04-27 NOTE — Discharge Instructions (Addendum)
Your COVID 19 results should result within 2-4 days- however can take up to 4 days at times of increased test volume. A somewhat onset of your symptoms your quarantine will end regardless on 04/29/2021. Negative results are immediately resulted to Mychart. Positive results will receive a follow-up call from our clinic. If symptoms are present, I recommend home quarantine until results are known.  Alternate Tylenol and ibuprofen as needed for body aches and fever.  Symptom management per recommendations discussed today.  If any breathing difficulty or chest pain develops go immediately to the closest emergency department for evaluation.

## 2021-04-27 NOTE — ED Provider Notes (Signed)
[facility]    CSN: [id number] Arrival date & time: 04/27/21  1350      History   Chief Complaint Chief Complaint  Patient presents with   Headache   Sore Throat   Appointment    1400    Fever    HPI Sheri Wyatt is a 35 y.o. female.   HPI Patient presents with URI symptoms including cough, sore throat, otalgia, nasal congestion, nasal congestion.  Patient has had COVID twice since the onset of the pandemic.  She denies any known exposure.  She has recently been on vacation at the [place] and endorses being outdoors for extended periods of time however symptoms only began 3 days ago.  She did have fever earlier today T-max 100.5.  She is fully vaccinated.  She has began using her nebulizer treatments since symptoms began.   She has had a persistent cough with mild chest tightness and shortness of breath due to persistency of coughing.  Cough is nonproductive.  She takes allergy medicine year-round.   Past Medical History:  Diagnosis Date   Anxiety    Asthma    Interstitial cystitis    Melanoma ([place])    Melanoma ([place])     There are no problems to display for this patient.   Past Surgical History:  Procedure Laterality Date   NORPLANT REMOVAL  04/28/2012   Procedure: REMOVAL OF NORPLANT;  Surgeon: Florian Buff, MD;  Location: [facility];  Service: Gynecology;  Laterality: N/A;  removal of implanon-started at [place]     as child   VAGINAL HYSTERECTOMY  04/28/2012   Procedure: HYSTERECTOMY VAGINAL;  Surgeon: Florian Buff, MD;  Location: [facility];  Service: Gynecology;  Laterality: N/A;    OB History     Gravida  1   Para  1   Term  1   Preterm      AB      Living  1      SAB      IAB      Ectopic      Multiple      Live Births               Home Medications    Prior to Admission medications   Medication Sig Start Date End Date Taking? Authorizing Provider  brompheniramine-pseudoephedrine-DM 30-2-10 MG/5ML syrup  Take 5 mLs by mouth 4 (four) times daily as needed. 04/27/21  Yes Scot Jun, FNP  ipratropium-albuterol (DUONEB) 0.5-2.5 (3) MG/3ML SOLN Take 3 mLs by nebulization every 6 (six) hours as needed. 04/27/21  Yes Scot Jun, FNP  predniSONE (DELTASONE) 20 MG tablet Take 1 tablet (20 mg total) by mouth daily with breakfast for 1 day. 04/27/21 04/28/21 Yes Scot Jun, FNP  cetirizine (ZYRTEC) 10 MG chewable tablet Chew 10 mg by mouth daily.    [provider]  ciprofloxacin (CIPRO) 500 MG tablet Take 1 tablet (500 mg total) by mouth 2 (two) times daily. 10/30/20   Florian Buff, MD  methylPREDNISolone (MEDROL) 4 MG tablet Take 4 mg by mouth daily.    [provider]  URELLE (URELLE/URISED) 81 MG TABS tablet Take 1 tablet (81 mg total) by mouth 4 (four) times daily. 01/18/15   Florian Buff, MD    Family History Family History  Problem Relation Age of Onset   Cancer Mother        melanoma   Hypertension Mother  Asthma Daughter    Heart disease Maternal Grandfather    Seizures Maternal Grandfather        mini   Cancer Paternal Grandfather        prostate     Social History Social History   Tobacco Use   Smoking status: Former    Packs/day: 0.25    Years: 1.50    Pack years: 0.38    Types: Cigarettes    Quit date: 04/22/2003    Years since quitting: 18.0   Smokeless tobacco: Never  Vaping Use   Vaping Use: Never used  Substance Use Topics   Alcohol use: Yes    Alcohol/week: 0.0 standard drinks    Comment: occassional   Drug use: No    Comment: cocoaine- and ectasy 8 yrs ago     Allergies   Corn-containing products   Review of Systems Review of Systems Pertinent negatives listed in HPI   Physical Exam Triage Vital Signs ED Triage Vitals [04/27/21 1500]  Enc Vitals Group     BP 130/79     Pulse Rate 98     Resp 16     Temp 98.7 F (37.1 C)     Temp Source Oral     SpO2 99 %     Weight      Height      Head Circumference       Peak Flow      Pain Score      Pain Loc      Pain Edu?      Excl. in [place]?    No data found.  Updated Vital Signs BP 130/79 (BP Location: Right Arm)   Pulse 98   Temp 98.7 F (37.1 C) (Oral)   Resp 16   LMP 04/18/2012   SpO2 99%   Visual Acuity Right Eye Distance:   Left Eye Distance:   Bilateral Distance:    Right Eye Near:   Left Eye Near:    Bilateral Near:     Physical Exam General appearance: alert, Ill-appearing, no distress Head: Normocephalic, without obvious abnormality, atraumatic ENT: Ears bilateral congestion, nares mucosal edema, congestion, erythematous oropharynx w/o exudate Respiratory: Respirations even , unlabored, coarse lung sound, expiratory wheeze Heart: rate and rhythm normal. No gallop or murmurs noted on exam  Abdomen: BS +, no distention, no rebound tenderness, or no mass Extremities: No gross deformities Skin: Skin color, texture, turgor normal. No rashes seen  Psych: Appropriate mood and affect. Neurologic: GCS 15 normal coordination normal gait. UC Treatments / Results  Labs (all labs ordered are listed, but only abnormal results are displayed) Labs Reviewed  COVID-19, FLU A+B NAA    EKG   Radiology No results found.  Procedures Procedures (including critical care time)  Medications Ordered in UC Medications  methylPREDNISolone sodium succinate (SOLU-MEDROL) 125 mg/2 mL injection 125 mg (125 mg Intramuscular Given 04/27/21 1530)    Initial Impression / Assessment and Plan / UC Course  I have reviewed the triage vital signs and the nursing notes.  Pertinent labs & imaging results that were available during my care of the patient were reviewed by me and considered in my medical decision making (see chart for details).    COVID/Flu test pending. Symptom management warranted only.  Manage fever with Tylenol and ibuprofen.  Steroid IM injection given here in clinic.  Patient will start a oral course of prednisone for bronchitis  tomorrow.  Refilled nebulizer treatments.  Bromfed as needed for cough  management.  Patient advised that quarantine period will end on 04/29/2021 regardless of COVID results given the timeframe symptoms have been present.  Patient was advised if fever persists she should add an additional day for quarantine until fever resolves.  Nasal symptoms with over-the-counter antihistamines recommended.  Treatment per discharge medications/discharge instructions.  Red flags/ER precautions given.  Final Clinical Impressions(s) / UC Diagnoses   Final diagnoses:  Acute bronchitis, unspecified organism  Allergic rhinitis due to other allergic trigger, unspecified seasonality  Otalgia of both ears  Sore throat     Discharge Instructions      Your COVID 19 results should result within 2-4 days- however can take up to 4 days at times of increased test volume. A somewhat onset of your symptoms your quarantine will end regardless on 04/29/2021. Negative results are immediately resulted to Mychart. Positive results will receive a follow-up call from our clinic. If symptoms are present, I recommend home quarantine until results are known.  Alternate Tylenol and ibuprofen as needed for body aches and fever.  Symptom management per recommendations discussed today.  If any breathing difficulty or chest pain develops go immediately to the closest emergency department for evaluation.      ED Prescriptions     Medication Sig Dispense Auth. Provider   predniSONE (DELTASONE) 20 MG tablet Take 1 tablet (20 mg total) by mouth daily with breakfast for 1 day. 1 tablet Scot Jun, FNP   brompheniramine-pseudoephedrine-DM 30-2-10 MG/5ML syrup Take 5 mLs by mouth 4 (four) times daily as needed. 180 mL Scot Jun, FNP   ipratropium-albuterol (DUONEB) 0.5-2.5 (3) MG/3ML SOLN Take 3 mLs by nebulization every 6 (six) hours as needed. 360 mL Scot Jun, FNP      PDMP not reviewed this encounter.    Scot Jun, FNP 04/27/21 1540

## 2021-04-27 NOTE — ED Triage Notes (Signed)
Patient presents to Urgent Care with complaints of sore throat, headache, fever, and body aches x 3 days. Treating with tylenol.

## 2021-04-28 LAB — COVID-19, FLU A+B NAA
Influenza A, NAA: NOT DETECTED
Influenza B, NAA: NOT DETECTED
SARS-CoV-2, NAA: DETECTED — AB

## 2021-05-10 ENCOUNTER — Encounter: Payer: Self-pay | Admitting: Emergency Medicine

## 2021-05-10 ENCOUNTER — Ambulatory Visit
Admission: EM | Admit: 2021-05-10 | Discharge: 2021-05-10 | Disposition: A | Discharge status: Home / Self Care | Payer: 59 | Attending: Emergency Medicine | Admitting: Emergency Medicine

## 2021-05-10 ENCOUNTER — Ambulatory Visit: Payer: Self-pay

## 2021-05-10 DIAGNOSIS — R109 Unspecified abdominal pain: Secondary | ICD-10-CM | POA: Diagnosis not present

## 2021-05-10 LAB — POCT URINE PREGNANCY: Preg Test, Ur: NEGATIVE

## 2021-05-10 LAB — POCT URINALYSIS DIP (MANUAL ENTRY)
Bilirubin, UA: NEGATIVE
Blood, UA: NEGATIVE
Glucose, UA: NEGATIVE mg/dL
Ketones, POC UA: NEGATIVE mg/dL
Leukocytes, UA: NEGATIVE
Nitrite, UA: NEGATIVE
Protein Ur, POC: NEGATIVE mg/dL
Spec Grav, UA: 1.02 (ref 1.010–1.025)
Urobilinogen, UA: 1 E.U./dL
pH, UA: 8.5 — AB (ref 5.0–8.0)

## 2021-05-10 LAB — POCT FASTING CBG KUC MANUAL ENTRY: POCT Glucose (KUC): 98 mg/dL (ref 70–99)

## 2021-05-10 MED ORDER — ALUM & MAG HYDROXIDE-SIMETH 200-200-20 MG/5ML PO SUSP
30.0000 mL | Freq: Once | ORAL | Status: AC
  Administered 2021-05-10: 30 mL via ORAL

## 2021-05-10 NOTE — Discharge Instructions (Addendum)
Unable to determine cause of abdominal bloating/ discomfort in urgent care setting.  Offered patient further evaluation and management in the ED.  Patient declines at this time and would like to try outpatient therapy first.  Aware of the risk associated with this decision including missed diagnosis, organ damage, organ failure, and/or death.  Patient aware and in agreement.     We will trial GI cocktail Urine without signs of infection Urine pregnancy negative Blood glucose 98 Follow up with PCP If you experience new or worsening symptoms return or go to ER such as fever, chills, nausea, vomiting, diarrhea, bloody or dark tarry stools, constipation, urinary symptoms, worsening abdominal discomfort, symptoms that do not improve with medications, inability to keep fluids down, etc..Marland Kitchen

## 2021-05-10 NOTE — ED Provider Notes (Addendum)
[facility]   [id number] 05/10/21 Arrival Time: 75  CC: ABDOMINAL pressure  SUBJECTIVE:  Sheri Wyatt is a 35 y.o. female who presents with complaint of abdominal bloating that began few days.  Denies a precipitating event, trauma, close contacts with similar symptoms, recent travel or antibiotic use.  Reports recent covid infection and currently on prednisone taper.  Generalized abdominal pressure.  Describes as constant and bloating.  Has not tried OTC medications.  Denies alleviating or aggravating factors.  Denies similar symptoms in the past.  Complains of brain fog, back pain, nausea, and increased urination.  Last BM with larger than normal BM.    Denies fever, chills, vomiting, chest pain, SOB, diarrhea, constipation, hematochezia, melena, dysuria, difficulty urinating, increased frequency or urgency, flank pain, loss of bowel or bladder function, vaginal discharge, vaginal odor, vaginal bleeding, dyspareunia, pelvic pain.     Patient's last menstrual period was 04/18/2012.  ROS: As per HPI.  All other pertinent ROS negative.     Past Medical History:  Diagnosis Date   Anxiety    Asthma    Interstitial cystitis    Melanoma ([place])    Melanoma ([place])    Past Surgical History:  Procedure Laterality Date   NORPLANT REMOVAL  04/28/2012   Procedure: REMOVAL OF NORPLANT;  Surgeon: Florian Buff, MD;  Location: [facility];  Service: Gynecology;  Laterality: N/A;  removal of implanon-started at [place]     as child   VAGINAL HYSTERECTOMY  04/28/2012   Procedure: HYSTERECTOMY VAGINAL;  Surgeon: Florian Buff, MD;  Location: [facility];  Service: Gynecology;  Laterality: N/A;   Allergies  Allergen Reactions   Corn-Containing Products Hives and Itching    REACTION: Sneezing   No current facility-administered medications on file prior to encounter.   Current Outpatient Medications on File Prior to Encounter  Medication Sig Dispense Refill    brompheniramine-pseudoephedrine-DM 30-2-10 MG/5ML syrup Take 5 mLs by mouth 4 (four) times daily as needed. 180 mL 0   cetirizine (ZYRTEC) 10 MG chewable tablet Chew 10 mg by mouth daily.     ipratropium-albuterol (DUONEB) 0.5-2.5 (3) MG/3ML SOLN Take 3 mLs by nebulization every 6 (six) hours as needed. 360 mL 0   methylPREDNISolone (MEDROL) 4 MG tablet Take 4 mg by mouth daily.     URELLE (URELLE/URISED) 81 MG TABS tablet Take 1 tablet (81 mg total) by mouth 4 (four) times daily. 120 each 4   Social History   Socioeconomic History   Marital status: Married    Spouse name: Not on file   Number of children: Not on file   Years of education: Not on file   Highest education level: Not on file  Occupational History   Not on file  Tobacco Use   Smoking status: Former    Packs/day: 0.25    Years: 1.50    Pack years: 0.38    Types: Cigarettes    Quit date: 04/22/2003    Years since quitting: 18.0   Smokeless tobacco: Never  Vaping Use   Vaping Use: Never used  Substance and Sexual Activity   Alcohol use: Yes    Alcohol/week: 0.0 standard drinks    Comment: occassional   Drug use: No    Comment: cocoaine- and ectasy 8 yrs ago   Sexual activity: Yes    Birth control/protection: Surgical  Other Topics Concern   Not on file  Social History Narrative   Not on file  Social Determinants of Health   Financial Resource Strain: Not on file  Food Insecurity: Not on file  Transportation Needs: Not on file  Physical Activity: Not on file  Stress: Not on file  Social Connections: Not on file  Intimate Partner Violence: Not on file   Family History  Problem Relation Age of Onset   Cancer Mother        melanoma   Hypertension Mother    Asthma Daughter    Heart disease Maternal Grandfather    Seizures Maternal Grandfather        mini   Cancer Paternal Grandfather        prostate      OBJECTIVE:  Vitals:   05/10/21 1812  BP: 110/77  Pulse: (!) 106  Resp: 16  Temp: (!)  97.5 F (36.4 C)  TempSrc: Oral  SpO2: 97%    General appearance: Alert; NAD HEENT: NCAT.  Oropharynx clear.  Lungs: clear to auscultation bilaterally without adventitious breath sounds Heart: regular rate and rhythm.   Abdomen: soft, non-distended; normal active bowel sounds; non-tender to light and deep palpation; nontender at McBurney's point; negative Murphy's sign; negative rebound; no guarding Back: no CVA tenderness Extremities: no edema; symmetrical with no gross deformities Skin: warm and dry Neurologic: normal gait Psychological: alert and cooperative; normal mood and affect  LABS: Results for orders placed or performed during the hospital encounter of 05/10/21 (from the past 24 hour(s))  POCT urinalysis dipstick     Status: Abnormal   Collection Time: 05/10/21  6:23 PM  Result Value Ref Range   Color, UA yellow yellow   Clarity, UA clear clear   Glucose, UA negative negative mg/dL   Bilirubin, UA negative negative   Ketones, POC UA negative negative mg/dL   Spec Grav, UA 1.020 1.010 - 1.025   Blood, UA negative negative   pH, UA 8.5 (A) 5.0 - 8.0   Protein Ur, POC negative negative mg/dL   Urobilinogen, UA 1.0 0.2 or 1.0 E.U./dL   Nitrite, UA Negative Negative   Leukocytes, UA Negative Negative  POCT urine pregnancy     Status: None   Collection Time: 05/10/21  6:36 PM  Result Value Ref Range   Preg Test, Ur Negative Negative  POCT CBG (manual entry)     Status: None   Collection Time: 05/10/21  6:47 PM  Result Value Ref Range   POCT Glucose ([facility]) 98 70 - 99 mg/dL   ASSESSMENT & PLAN:  1. Abdominal pressure   2. Abdominal discomfort     Meds ordered this encounter  Medications   alum & mag hydroxide-simeth (MAALOX/MYLANTA) 200-200-20 MG/5ML suspension 30 mL   Unable to determine cause of abdominal bloating/ discomfort in urgent care setting.  Offered patient further evaluation and management in the ED.  Patient declines at this time and would like to try  outpatient therapy first.  Aware of the risk associated with this decision including missed diagnosis, organ damage, organ failure, and/or death.  Patient aware and in agreement.     We will trial GI cocktail Urine without signs of infection Urine pregnancy negative Blood glucose 98 Follow up with PCP If you experience new or worsening symptoms return or go to ER such as fever, chills, nausea, vomiting, diarrhea, bloody or dark tarry stools, constipation, urinary symptoms, worsening abdominal discomfort, symptoms that do not improve with medications, inability to keep fluids down, etc...  Reviewed expectations re: course of current medical issues. Questions answered. Outlined signs  and symptoms indicating need for more acute intervention. Patient verbalized understanding. After Visit Summary given.   Lestine Box, PA-C 05/10/21 [place], [place], PA-C 05/10/21 [phone]

## 2021-05-10 NOTE — ED Triage Notes (Signed)
Abd pain and back pain.  States her stomach fills swollen and she has been urinating a lot.  States she had covid a few weeks ago and her head feels like she is in a fog.

## 2021-05-12 ENCOUNTER — Emergency Department (HOSPITAL_COMMUNITY)
Admission: EM | Admit: 2021-05-12 | Discharge: 2021-05-12 | Disposition: A | Discharge status: Home / Self Care | Payer: 59 | Attending: Emergency Medicine | Admitting: Emergency Medicine

## 2021-05-12 ENCOUNTER — Encounter (HOSPITAL_COMMUNITY): Payer: Self-pay | Admitting: Emergency Medicine

## 2021-05-12 ENCOUNTER — Emergency Department (HOSPITAL_COMMUNITY): Payer: 59

## 2021-05-12 ENCOUNTER — Other Ambulatory Visit: Payer: Self-pay

## 2021-05-12 DIAGNOSIS — Z8616 Personal history of COVID-19: Secondary | ICD-10-CM | POA: Insufficient documentation

## 2021-05-12 DIAGNOSIS — J45909 Unspecified asthma, uncomplicated: Secondary | ICD-10-CM | POA: Insufficient documentation

## 2021-05-12 DIAGNOSIS — R110 Nausea: Secondary | ICD-10-CM | POA: Diagnosis not present

## 2021-05-12 DIAGNOSIS — Z87891 Personal history of nicotine dependence: Secondary | ICD-10-CM | POA: Insufficient documentation

## 2021-05-12 DIAGNOSIS — R140 Abdominal distension (gaseous): Secondary | ICD-10-CM | POA: Diagnosis not present

## 2021-05-12 DIAGNOSIS — R109 Unspecified abdominal pain: Secondary | ICD-10-CM | POA: Diagnosis present

## 2021-05-12 LAB — URINALYSIS, ROUTINE W REFLEX MICROSCOPIC
Bilirubin Urine: NEGATIVE
Glucose, UA: NEGATIVE mg/dL
Ketones, ur: NEGATIVE mg/dL
Leukocytes,Ua: NEGATIVE
Nitrite: NEGATIVE
Protein, ur: NEGATIVE mg/dL
Specific Gravity, Urine: 1.018 (ref 1.005–1.030)
pH: 6 (ref 5.0–8.0)

## 2021-05-12 LAB — CBC WITH DIFFERENTIAL/PLATELET
Abs Immature Granulocytes: 0.08 10*3/uL — ABNORMAL HIGH (ref 0.00–0.07)
Basophils Absolute: 0 10*3/uL (ref 0.0–0.1)
Basophils Relative: 0 %
Eosinophils Absolute: 0.2 10*3/uL (ref 0.0–0.5)
Eosinophils Relative: 3 %
HCT: 42.7 % (ref 36.0–46.0)
Hemoglobin: 14.4 g/dL (ref 12.0–15.0)
Immature Granulocytes: 1 %
Lymphocytes Relative: 31 %
Lymphs Abs: 2.3 10*3/uL (ref 0.7–4.0)
MCH: 29.4 pg (ref 26.0–34.0)
MCHC: 33.7 g/dL (ref 30.0–36.0)
MCV: 87.1 fL (ref 80.0–100.0)
Monocytes Absolute: 0.6 10*3/uL (ref 0.1–1.0)
Monocytes Relative: 8 %
Neutro Abs: 4.3 10*3/uL (ref 1.7–7.7)
Neutrophils Relative %: 57 %
Platelets: 233 10*3/uL (ref 150–400)
RBC: 4.9 MIL/uL (ref 3.87–5.11)
RDW: 12.7 % (ref 11.5–15.5)
WBC: 7.5 10*3/uL (ref 4.0–10.5)
nRBC: 0 % (ref 0.0–0.2)

## 2021-05-12 LAB — COMPREHENSIVE METABOLIC PANEL
ALT: 62 U/L — ABNORMAL HIGH (ref 0–44)
AST: 34 U/L (ref 15–41)
Albumin: 3.6 g/dL (ref 3.5–5.0)
Alkaline Phosphatase: 55 U/L (ref 38–126)
Anion gap: 5 (ref 5–15)
BUN: 19 mg/dL (ref 6–20)
CO2: 24 mmol/L (ref 22–32)
Calcium: 8.7 mg/dL — ABNORMAL LOW (ref 8.9–10.3)
Chloride: 105 mmol/L (ref 98–111)
Creatinine, Ser: 0.59 mg/dL (ref 0.44–1.00)
GFR, Estimated: >60 mL/min (ref >60)
Glucose, Bld: 90 mg/dL (ref 70–99)
Potassium: 3.9 mmol/L (ref 3.5–5.1)
Sodium: 134 mmol/L — ABNORMAL LOW (ref 135–145)
Total Bilirubin: 0.5 mg/dL (ref 0.3–1.2)
Total Protein: 6.2 g/dL — ABNORMAL LOW (ref 6.5–8.1)

## 2021-05-12 LAB — LIPASE, BLOOD: Lipase: 38 U/L (ref 11–51)

## 2021-05-12 MED ORDER — IOHEXOL 300 MG/ML  SOLN
100.0000 mL | Freq: Once | INTRAMUSCULAR | Status: AC | PRN
  Administered 2021-05-12: 100 mL via INTRAVENOUS

## 2021-05-12 NOTE — ED Triage Notes (Signed)
Pt c/o generalized abd pain with bloating and right sided flank pain; was seen at [facility] for same; LBM 05/11/2021

## 2021-05-12 NOTE — Discharge Instructions (Addendum)
Your lab tests and your CT imaging are stable with no evidence of intra abdominal problems causing your bloating.  I would suggest taking a medication such as Maalox or Mylanta which may help you with this symptom.  Plan to see your doctor for recheck if your symptoms persist or worsen.

## 2021-05-13 NOTE — ED Provider Notes (Signed)
[facility] EMERGENCY DEPARTMENT Provider Note   CSN: [id number] Arrival date & time: 05/12/21  [id number]     History Chief Complaint  Patient presents with   Abdominal Pain    Sheri Wyatt is a 35 y.o. female with a history of anxiety, asthma, interstitial cystitis and multiple skin melanoma's presenting with an approximate 5 day history of abdominal discomfort and bloating, denying actual abdominal pain.  She was diagnosed with Covid 19 earlier this month and had completed a prednisone taper, no other medication chances.  She also denies any changes in her diet or pattern of food intake.  She has found no  alleviating or worsening factors.  She has had no fevers or chills, sob, cp, vomiting, diarrhea, constipation, dysuria or vaginal concerns.  She does endorse some mild nausea, also states has noted occasional hot flashes (no documented fevers) and was told she has a good chance of premature menopause, questioning if this could be the source of her sx.   She was seen at Urgent Care 2 days ago at which time UA was negative.  She was given a GI cocktail with no significant improvement   The history is provided by the patient.      Past Medical History:  Diagnosis Date   Anxiety    Asthma    Interstitial cystitis    Melanoma ([place])    Melanoma ([place])     There are no problems to display for this patient.   Past Surgical History:  Procedure Laterality Date   NORPLANT REMOVAL  04/28/2012   Procedure: REMOVAL OF NORPLANT;  Surgeon: Florian Buff, MD;  Location: [facility];  Service: Gynecology;  Laterality: N/A;  removal of implanon-started at [place]     as child   VAGINAL HYSTERECTOMY  04/28/2012   Procedure: HYSTERECTOMY VAGINAL;  Surgeon: Florian Buff, MD;  Location: [facility];  Service: Gynecology;  Laterality: N/A;     OB History     Gravida  1   Para  1   Term  1   Preterm      AB      Living  1      SAB      IAB      Ectopic      Multiple       Live Births              Family History  Problem Relation Age of Onset   Cancer Mother        melanoma   Hypertension Mother    Asthma Daughter    Heart disease Maternal Grandfather    Seizures Maternal Grandfather        mini   Cancer Paternal Grandfather        prostate     Social History   Tobacco Use   Smoking status: Former    Packs/day: 0.25    Years: 1.50    Pack years: 0.38    Types: Cigarettes    Quit date: 04/22/2003    Years since quitting: 18.0   Smokeless tobacco: Never  Vaping Use   Vaping Use: Never used  Substance Use Topics   Alcohol use: Yes    Alcohol/week: 0.0 standard drinks    Comment: occassional   Drug use: No    Comment: cocoaine- and ectasy 8 yrs ago    Home Medications Prior to Admission medications   Medication Sig Start Date End Date Taking? Authorizing Provider  brompheniramine-pseudoephedrine-DM 30-2-10 MG/5ML syrup Take 5 mLs by mouth 4 (four) times daily as needed. 04/27/21   Scot Jun, FNP  cetirizine (ZYRTEC) 10 MG chewable tablet Chew 10 mg by mouth daily.    [provider]  ipratropium-albuterol (DUONEB) 0.5-2.5 (3) MG/3ML SOLN Take 3 mLs by nebulization every 6 (six) hours as needed. 04/27/21   Scot Jun, FNP  methylPREDNISolone (MEDROL) 4 MG tablet Take 4 mg by mouth daily.    [provider]  URELLE (URELLE/URISED) 81 MG TABS tablet Take 1 tablet (81 mg total) by mouth 4 (four) times daily. 01/18/15   Florian Buff, MD    Allergies    Corn-containing products  Review of Systems   Review of Systems  Constitutional:  Positive for diaphoresis. Negative for fever.  HENT: Negative.    Eyes: Negative.   Respiratory:  Negative for chest tightness and shortness of breath.   Cardiovascular:  Negative for chest pain.  Gastrointestinal:  Positive for abdominal distention and nausea. Negative for constipation, diarrhea and vomiting.  Genitourinary: Negative.   Musculoskeletal: Negative.    Skin: Negative.  Negative for rash and wound.  Neurological: Negative.  Negative for weakness.  Psychiatric/Behavioral: Negative.     Physical Exam Updated Vital Signs BP 99/76   Pulse 72   Temp 98.3 F (36.8 C) (Oral)   Resp 18   Ht '5\' 3"'$  (1.6 m)   Wt 64.9 kg   LMP 04/18/2012   SpO2 95%   BMI 25.33 kg/m   Physical Exam Vitals and nursing note reviewed.  Constitutional:      Appearance: She is well-developed.  HENT:     Head: Normocephalic and atraumatic.  Eyes:     Conjunctiva/sclera: Conjunctivae normal.  Cardiovascular:     Rate and Rhythm: Normal rate and regular rhythm.     Heart sounds: Normal heart sounds.  Pulmonary:     Effort: Pulmonary effort is normal.     Breath sounds: Normal breath sounds. No wheezing.  Abdominal:     General: Bowel sounds are normal. There is distension.     Palpations: Abdomen is soft.     Tenderness: There is no abdominal tenderness. There is no guarding or rebound.     Comments: Mild distention, no guarding. Normal tympany.   Musculoskeletal:        General: Normal range of motion.     Cervical back: Normal range of motion.  Skin:    General: Skin is warm and dry.  Neurological:     Mental Status: She is alert.    ED Results / Procedures / Treatments   Labs (all labs ordered are listed, but only abnormal results are displayed) Labs Reviewed  COMPREHENSIVE METABOLIC PANEL - Abnormal; Notable for the following components:      Result Value   Sodium 134 (*)    Calcium 8.7 (*)    Total Protein 6.2 (*)    ALT 62 (*)    All other components within normal limits  URINALYSIS, ROUTINE W REFLEX MICROSCOPIC - Abnormal; Notable for the following components:   APPearance HAZY (*)    Hgb urine dipstick SMALL (*)    Bacteria, UA RARE (*)    All other components within normal limits  CBC WITH DIFFERENTIAL/PLATELET - Abnormal; Notable for the following components:   Abs Immature Granulocytes 0.08 (*)    All other components within  normal limits  LIPASE, BLOOD    EKG None  Radiology CT ABDOMEN PELVIS W  CONTRAST  Result Date: 05/12/2021 CLINICAL DATA:  Abdominal distension. EXAM: CT ABDOMEN AND PELVIS WITH CONTRAST TECHNIQUE: Multidetector CT imaging of the abdomen and pelvis was performed using the standard protocol following bolus administration of intravenous contrast. CONTRAST:  171m OMNIPAQUE IOHEXOL 300 MG/ML  SOLN COMPARISON:  December 29, 2011. FINDINGS: Lower chest: No acute abnormality. Hepatobiliary: No focal liver abnormality is seen. No gallstones, gallbladder wall thickening, or biliary dilatation. Pancreas: Unremarkable. No pancreatic ductal dilatation or surrounding inflammatory changes. Spleen: Normal in size without focal abnormality. Adrenals/Urinary Tract: Adrenal glands are unremarkable. Kidneys are normal, without renal calculi, focal lesion, or hydronephrosis. Bladder is unremarkable. Stomach/Bowel: Stomach is within normal limits. Appendix appears normal. No evidence of bowel wall thickening, distention, or inflammatory changes. Vascular/Lymphatic: No significant vascular findings are present. No enlarged abdominal or pelvic lymph nodes. Reproductive: Status post hysterectomy. No adnexal masses. Other: No abdominal wall hernia or abnormality. No abdominopelvic ascites. Musculoskeletal: No acute or significant osseous findings. IMPRESSION: No definite abnormality seen in the abdomen or pelvis. Electronically Signed   By: JMarijo ConceptionM.D.   On: 05/12/2021 12:04    Procedures Procedures   Medications Ordered in ED Medications  iohexol (OMNIPAQUE) 300 MG/ML solution 100 mL (100 mLs Intravenous Contrast Given 05/12/21 1154)    ED Course  I have reviewed the triage vital signs and the nursing notes.  Pertinent labs & imaging results that were available during my care of the patient were reviewed by me and considered in my medical decision making (see chart for details).    MDM Rules/Calculators/A&P                            Pt with abdominal bloating and discomfort of unclear etiology.  Labs and CT scan reassuring.  No sign of bowel obstruction or mass.  No adnexal mass. Suggested maalox or mylanta and plan f/u with pcp for a recheck for any persistent or worsened sx.  Final Clinical Impression(s) / ED Diagnoses Final diagnoses:  Abdominal bloating    Rx / DC Orders ED Discharge Orders     None        ILandis Martins07/25/22 1[place]   WDaleen Bo MD 05/14/21 2204

## 2021-07-09 ENCOUNTER — Encounter: Payer: Self-pay | Admitting: Internal Medicine

## 2021-07-31 ENCOUNTER — Encounter: Payer: Self-pay | Admitting: Internal Medicine

## 2021-08-07 ENCOUNTER — Ambulatory Visit (INDEPENDENT_AMBULATORY_CARE_PROVIDER_SITE_OTHER): Payer: 59 | Admitting: Internal Medicine

## 2021-08-07 ENCOUNTER — Encounter: Payer: Self-pay | Admitting: Internal Medicine

## 2021-08-07 ENCOUNTER — Other Ambulatory Visit (INDEPENDENT_AMBULATORY_CARE_PROVIDER_SITE_OTHER): Payer: 59

## 2021-08-07 VITALS — BP 102/60 | HR 87 | Ht 63.0 in | Wt 144.9 lb

## 2021-08-07 DIAGNOSIS — K625 Hemorrhage of anus and rectum: Secondary | ICD-10-CM

## 2021-08-07 DIAGNOSIS — R140 Abdominal distension (gaseous): Secondary | ICD-10-CM

## 2021-08-07 DIAGNOSIS — R7989 Other specified abnormal findings of blood chemistry: Secondary | ICD-10-CM

## 2021-08-07 DIAGNOSIS — R1084 Generalized abdominal pain: Secondary | ICD-10-CM

## 2021-08-07 LAB — PROTIME-INR
INR: 1.1 ratio — ABNORMAL HIGH (ref 0.8–1.0)
Prothrombin Time: 11.6 s (ref 9.6–13.1)

## 2021-08-07 LAB — IBC + FERRITIN
Ferritin: 99.9 ng/mL (ref 10.0–291.0)
Iron: 52 ug/dL (ref 42–145)
Saturation Ratios: 15.5 % — ABNORMAL LOW (ref 20.0–50.0)
TIBC: 334.6 ug/dL (ref 250.0–450.0)
Transferrin: 239 mg/dL (ref 212.0–360.0)

## 2021-08-07 NOTE — Progress Notes (Signed)
Chief Complaint: Abdominal bloating and elevated LFTs  HPI : 35 year old female with history of asthma, anxiety, and melanoma (in remission) presents with abdominal bloating and elevated LFTs  She has had issues with abdominal bloating over the last 3 months. She also has had issues with rectal discomfort and pain in the LLQ. Yesterday she felt nauseous and felt like she was going to pass out. She has been able to maintain good PO intake. Occasional nausea but denies vomiting. Having one BM per day but feels that it is less stool than normal. Has seen occasional small amounts of bright red blood in her stool. Denies black stools. Denies dysphagia. Denies unintentional weight loss. Denies fam hx of liver issues. Has multiple great grandparents with colon cancer. Mother had colon polyps, unclear what kind. She has history of hemorrhoids. Has had one daughter via vaginal delivery. Has had a partial hysterectomy in the past. Used to take Optavia weight loss supplement (last took 6 months ago). 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 Has never been told that she has had liver issues prior to 3 months ago.  She was recently seen in urgent care on 05/10/21 and in the ED on 05/12/21 with abdominal discomfort and bloating.  She had a UA and urine pregnancy test that were unremarkable.  CBC unremarkable.  CMP was only notable for a mildly elevated ALT of 62, other LFTs normal.  Lipase normal.  CT A/P with contrast did not reveal a source of her abdominal discomfort. She was given a GI cocktail with some mild improvement in her bloating.    Past Medical History:  Diagnosis Date   Anxiety    Asthma    Interstitial cystitis    Melanoma ([place])    Melanoma ([place])      Past Surgical History:  Procedure Laterality Date   NORPLANT REMOVAL  04/28/2012   Procedure: REMOVAL OF NORPLANT;  Surgeon: Florian Buff, MD;  Location: [facility];  Service: Gynecology;   Laterality: N/A;  removal of implanon-started at [place]     as child   VAGINAL HYSTERECTOMY  04/28/2012   Procedure: HYSTERECTOMY VAGINAL;  Surgeon: Florian Buff, MD;  Location: [facility];  Service: Gynecology;  Laterality: N/A;   Family History  Problem Relation Age of Onset   Colon polyps Mother    Cancer Mother        melanoma   Hypertension Mother    Heart disease Maternal Grandfather    Seizures Maternal Grandfather        mini   Cancer Paternal Grandfather        prostate    Asthma Daughter    Social History   Tobacco Use   Smoking status: Former    Packs/day: 0.25    Years: 1.50    Pack years: 0.38    Types: Cigarettes    Quit date: 04/22/2003    Years since quitting: 18.3   Smokeless tobacco: Never  Vaping Use   Vaping Use: Never used  Substance Use Topics   Alcohol use: Yes    Alcohol/week: 0.0 standard drinks    Comment: occassional   Drug use: No    Comment: cocoaine- and ectasy 8 yrs ago   Current Outpatient Medications  Medication Sig Dispense Refill   cetirizine (ZYRTEC) 10 MG chewable tablet Chew 10 mg by mouth daily.     URELLE (URELLE/URISED) 81 MG TABS  tablet Take 1 tablet (81 mg total) by mouth 4 (four) times daily. 120 each 4   No current facility-administered medications for this visit.   Allergies  Allergen Reactions   Corn-Containing Products Hives and Itching    REACTION: Sneezing     Review of Systems: All systems reviewed and negative except where noted in HPI.   Physical Exam: BP 102/60   Pulse 87   Ht 5' 3"  (1.6 m)   Wt 144 lb 14.4 oz (65.7 kg)   LMP 04/18/2012   SpO2 96%   BMI 25.67 kg/m  Constitutional: Pleasant,well-developed, female in no acute distress. HEENT: Normocephalic and atraumatic. Conjunctivae are normal. No scleral icterus. Cardiovascular: Normal rate Pulmonary/chest: No increased WOB Abdominal: Soft, nondistended, mildly tender in the LLQ, epigastric, and suprapublic areas. There are no masses  palpable. No hepatomegaly. Extremities: No edema Neurological: Alert and oriented to person place and time. Skin: Skin is warm and dry. No rashes noted. Psychiatric: Normal mood and affect. Behavior is normal.  Labs 05/12/21: CBC unremarkable. CMP with AST 34, ALT 62, alk phos 55, T bili 0.5. Lipase nml.  Labs 05/24/21: CMP with AST 42, ALT 77, Alk phos 60, T bili 0.3  CT A/P w/contrast 05/12/21: IMPRESSION: No definite abnormality seen in the abdomen or pelvis  ASSESSMENT AND PLAN: Abdominal pain Bloating Rectal bleeding Rectal pain Elevated LFTs Patient presents with 3 months of abdominal bloating and discomfort.  She has also noted rectal bleeding and rectal pain.  During an ED visit and subsequent clinic follow-up for these GI issues, she was noted incidentally to have a mild transaminitis. Per my independent review, her last CT A/P showed significant stool burden throughout the colon.  I suspect that many of her symptoms are due to constipation issues.  Her constipation is likely leading to an exacerbation of hemorrhoidal bleeding.  However, with the patient's family history of colon cancer and colon polyps, would be reasonable to rule out alternative causes of rectal bleeding by performing colonoscopy.  I offered the patient the option of performing a rectal exam or opting for colonoscopy.  Patient expressed concerns about rectal discomfort with a rectal exam (since she was already having some rectal pain) so we will proceed with a colonoscopy, which will allow for examination under sedation  Will start her on basic constipation therapies prior to her colonoscopy.  For her elevated LFTs, we will plan for comprehensive work-up due to her young age and the fact that she has no obvious risk factors for elevated LFTs - Encourage hydration and ambulation - Encourage Miralax QD, uptitrate as needed - Check INR, acute hepatitis panel, ANA, ASMA, IgG, AMA, iron/TIBC, A1AT, ceruloplasmin, TTG IgA,  IgA - Colonoscopy [facility]  [name], MD

## 2021-08-07 NOTE — Patient Instructions (Signed)
PROCEDURES: You have been scheduled for a colonoscopy. Please follow the written instructions given to you at your visit today. If you use inhalers (even only as needed), please bring them with you on the day of your procedure.  LABS:  Lab work has been ordered for you today. Our lab is located in the basement. Press "B" on the elevator. The lab is located at the first door on the left as you exit the elevator.  HEALTHCARE LAWS AND MY CHART RESULTS: Due to recent changes in healthcare laws, you may see the results of your imaging and laboratory studies on MyChart before your provider has had a chance to review them.   We understand that in some cases there may be results that are confusing or concerning to you. Not all laboratory results come back in the same time frame and the provider may be waiting for multiple results in order to interpret others.  Please give [place] 48 hours in order for your provider to thoroughly review all the results before contacting the office for clarification of your results.   RECOMMENDATIONS: Drink eight glasses of water ( 8 ounces a glass) daily. Miralax- Dissolve one capful in 8 ounces of water and drink daily, may increase if needed to twice a day.  It was great seeing you today! Thank you for entrusting me with your care and choosing [facility].  Dr. Lorenso Courier  The [facility] providers would like to encourage you to use [facility] to communicate with providers for non-urgent requests or questions.  Due to long hold times on the telephone, sending your provider a message by [facility] may be faster and more efficient way to get a response. Please allow 48 business hours for a response.  Please remember that this is for non-urgent requests/questions.  If you are age 18 or older, your body mass index should be between 23-30. Your Body mass index is 25.67 kg/m. If this is out of the aforementioned range listed, please consider follow up with your Primary Care  Provider.  If you are age 63 or younger, your body mass index should be between 19-25. Your Body mass index is 25.67 kg/m. If this is out of the aformentioned range listed, please consider follow up with your Primary Care Provider.

## 2021-08-11 LAB — HEPATITIS C ANTIBODY
Hepatitis C Ab: NONREACTIVE
SIGNAL TO CUT-OFF: 0.05 (ref <1.00)

## 2021-08-11 LAB — CERULOPLASMIN: Ceruloplasmin: 26 mg/dL (ref 18–53)

## 2021-08-11 LAB — HEPATITIS B SURFACE ANTIBODY,QUALITATIVE: Hep B S Ab: REACTIVE — AB

## 2021-08-11 LAB — ANTI-SMOOTH MUSCLE ANTIBODY, IGG: Actin (Smooth Muscle) Antibody (IGG): <20 U (ref <20)

## 2021-08-11 LAB — TISSUE TRANSGLUTAMINASE ABS,IGG,IGA
(tTG) Ab, IgA: <1 U/mL
(tTG) Ab, IgG: <1 U/mL

## 2021-08-11 LAB — IGA: Immunoglobulin A: 126 mg/dL (ref 47–310)

## 2021-08-11 LAB — HEPATITIS B SURFACE ANTIGEN: Hepatitis B Surface Ag: NONREACTIVE

## 2021-08-11 LAB — ANA: Anti Nuclear Antibody (ANA): POSITIVE — AB

## 2021-08-11 LAB — MITOCHONDRIAL ANTIBODIES: Mitochondrial M2 Ab, IgG: <=20 U (ref <=20.0)

## 2021-08-11 LAB — ALPHA-1-ANTITRYPSIN: A-1 Antitrypsin, Ser: 207 mg/dL — ABNORMAL HIGH (ref 83–199)

## 2021-08-11 LAB — HEPATITIS A ANTIBODY, TOTAL: Hepatitis A AB,Total: NONREACTIVE

## 2021-08-11 LAB — ANTI-NUCLEAR AB-TITER (ANA TITER): ANA Titer 1: 1:40 {titer} — ABNORMAL HIGH

## 2021-08-11 LAB — IGG: IgG (Immunoglobin G), Serum: 868 mg/dL (ref 600–1640)

## 2021-08-12 ENCOUNTER — Other Ambulatory Visit: Payer: Self-pay

## 2021-08-12 ENCOUNTER — Encounter: Payer: Self-pay | Admitting: Internal Medicine

## 2021-08-12 ENCOUNTER — Ambulatory Visit (AMBULATORY_SURGERY_CENTER): Payer: 59 | Admitting: Internal Medicine

## 2021-08-12 VITALS — BP 98/63 | HR 57 | Temp 97.5°F | Resp 16 | Ht 63.0 in | Wt 144.0 lb

## 2021-08-12 DIAGNOSIS — K648 Other hemorrhoids: Secondary | ICD-10-CM

## 2021-08-12 DIAGNOSIS — R109 Unspecified abdominal pain: Secondary | ICD-10-CM | POA: Diagnosis not present

## 2021-08-12 DIAGNOSIS — K625 Hemorrhage of anus and rectum: Secondary | ICD-10-CM

## 2021-08-12 DIAGNOSIS — R1084 Generalized abdominal pain: Secondary | ICD-10-CM

## 2021-08-12 DIAGNOSIS — K6289 Other specified diseases of anus and rectum: Secondary | ICD-10-CM

## 2021-08-12 MED ORDER — HYDROCORTISONE ACETATE 25 MG RE SUPP: 25.0000 mg | Freq: Two times a day (BID) | RECTAL | 0 refills | Status: DC

## 2021-08-12 MED ORDER — SODIUM CHLORIDE 0.9 % IV SOLN: 500.0000 mL | INTRAVENOUS | Status: DC

## 2021-08-12 NOTE — Progress Notes (Signed)
Called to room to assist during endoscopic procedure.  Patient ID and intended procedure confirmed with present staff. Received instructions for my participation in the procedure from the performing physician.  

## 2021-08-12 NOTE — Progress Notes (Signed)
Pt's states no medical or surgical changes since previsit or office visit.   DT vitals and PT IV.  BP 88/51 IV fluids running. Notified Bernie Covey. Pt did state that she felt like she was going "to pass out a few weeks ago while talking to her daughters dentist.

## 2021-08-12 NOTE — Patient Instructions (Signed)
YOU HAD AN ENDOSCOPIC PROCEDURE TODAY AT THE [facility]:   Refer to the procedure report that was given to you for any specific questions about what was found during the examination.  If the procedure report does not answer your questions, please call your gastroenterologist to clarify.  If you requested that your care partner not be given the details of your procedure findings, then the procedure report has been included in a sealed envelope for you to review at your convenience later.  YOU SHOULD EXPECT: Some feelings of bloating in the abdomen. Passage of more gas than usual.  Walking can help get rid of the air that was put into your GI tract during the procedure and reduce the bloating. If you had a lower endoscopy (such as a colonoscopy or flexible sigmoidoscopy) you may notice spotting of blood in your stool or on the toilet paper. If you underwent a bowel prep for your procedure, you may not have a normal bowel movement for a few days.  Please Note:  You might notice some irritation and congestion in your nose or some drainage.  This is from the oxygen used during your procedure.  There is no need for concern and it should clear up in a day or so.  SYMPTOMS TO REPORT IMMEDIATELY:   Following lower endoscopy (colonoscopy or flexible sigmoidoscopy):  Excessive amounts of blood in the stool  Significant tenderness or worsening of abdominal pains  Swelling of the abdomen that is new, acute  Fever of 100F or higher  For urgent or emergent issues, a gastroenterologist can be reached at any hour by calling [phone]. Do not use MyChart messaging for urgent concerns.    DIET:  We do recommend a small meal at first, but then you may proceed to your regular diet.  Drink plenty of fluids but you should avoid alcoholic beverages for 24 hours.  ACTIVITY:  You should plan to take it easy for the rest of today and you should NOT DRIVE or use heavy machinery until tomorrow (because  of the sedation medicines used during the test).    FOLLOW UP: Our staff will call the number listed on your records 48-72 hours following your procedure to check on you and address any questions or concerns that you may have regarding the information given to you following your procedure. If we do not reach you, we will leave a message.  We will attempt to reach you two times.  During this call, we will ask if you have developed any symptoms of COVID 19. If you develop any symptoms (ie: fever, flu-like symptoms, shortness of breath, cough etc.) before then, please call [phone].  If you test positive for Covid 19 in the 2 weeks post procedure, please call and report this information to us.    If any biopsies were taken you will be contacted by phone or by letter within the next 1-3 weeks.  Please call us at [phone] if you have not heard about the biopsies in 3 weeks.    SIGNATURES/CONFIDENTIALITY: You and/or your care partner have signed paperwork which will be entered into your electronic medical record.  These signatures attest to the fact that that the information above on your After Visit Summary has been reviewed and is understood.  Full responsibility of the confidentiality of this discharge information lies with you and/or your care-partner. 

## 2021-08-12 NOTE — Progress Notes (Signed)
Sedate, gd SR, tolerated procedure well, VSS, report to RN 

## 2021-08-12 NOTE — Progress Notes (Signed)
GASTROENTEROLOGY PROCEDURE H&P NOTE   Primary Care Physician: Sharilyn Sites, MD    Reason for Procedure:   Rectal bleeding, rectal pain  Plan:    Colonoscopy  Patient is appropriate for endoscopic procedure(s) in the ambulatory ([place]) setting.  The nature of the procedure, as well as the risks, benefits, and alternatives were carefully and thoroughly reviewed with the patient. Ample time for discussion and questions allowed. The patient understood, was satisfied, and agreed to proceed.     HPI: Sheri Wyatt is a 35 y.o. female who presents for Colonoscopy for evaluation of rectal bleeding and rectal pain .  Patient was most recently seen in the Gastroenterology Clinic on 08/07/21.  No interval change in medical history since that appointment. Please refer to that note for full details regarding GI history and clinical presentation.   Past Medical History:  Diagnosis Date   Anxiety    Asthma    Interstitial cystitis    Melanoma ([place])    Melanoma ([place])     Past Surgical History:  Procedure Laterality Date   NORPLANT REMOVAL  04/28/2012   Procedure: REMOVAL OF NORPLANT;  Surgeon: Florian Buff, MD;  Location: [facility];  Service: Gynecology;  Laterality: N/A;  removal of implanon-started at [place]     as child   VAGINAL HYSTERECTOMY  04/28/2012   Procedure: HYSTERECTOMY VAGINAL;  Surgeon: Florian Buff, MD;  Location: [facility];  Service: Gynecology;  Laterality: N/A;    Prior to Admission medications   Medication Sig Start Date End Date Taking? Authorizing Provider  cetirizine (ZYRTEC) 10 MG chewable tablet Chew 10 mg by mouth daily.   Yes [provider]  URELLE (URELLE/URISED) 81 MG TABS tablet Take 1 tablet (81 mg total) by mouth 4 (four) times daily. 01/18/15   Florian Buff, MD  valACYclovir (VALTREX) 500 MG tablet Take 500 tablets by mouth daily as needed. 06/12/21   [provider]    Current Outpatient Medications  Medication Sig  Dispense Refill   cetirizine (ZYRTEC) 10 MG chewable tablet Chew 10 mg by mouth daily.     URELLE (URELLE/URISED) 81 MG TABS tablet Take 1 tablet (81 mg total) by mouth 4 (four) times daily. 120 each 4   valACYclovir (VALTREX) 500 MG tablet Take 500 tablets by mouth daily as needed.     Current Facility-Administered Medications  Medication Dose Route Frequency Provider Last Rate Last Admin   0.9 %  sodium chloride infusion  500 mL Intravenous Continuous Sharyn Creamer, MD        Allergies as of 08/12/2021 - Review Complete 08/12/2021  Allergen Reaction Noted   Corn-containing products Hives and Itching 04/15/2012    Family History  Problem Relation Age of Onset   Colon polyps Mother    Cancer Mother        melanoma   Hypertension Mother    Heart disease Maternal Grandfather    Seizures Maternal Grandfather        mini   Cancer Paternal Grandfather        prostate    Asthma Daughter    Stomach cancer Other    Rectal cancer Other    Esophageal cancer Other    Colon cancer Other     Social History   Socioeconomic History   Marital status: Married    Spouse name: Not on file   Number of children: Not on file   Years of education: Not on file  Highest education level: Not on file  Occupational History   Not on file  Tobacco Use   Smoking status: Former    Packs/day: 0.25    Years: 1.50    Pack years: 0.38    Types: Cigarettes    Quit date: 04/22/2003    Years since quitting: 18.3   Smokeless tobacco: Never  Vaping Use   Vaping Use: Never used  Substance and Sexual Activity   Alcohol use: Yes    Alcohol/week: 0.0 standard drinks    Comment: occassional   Drug use: No    Comment: cocoaine- and ectasy 8 yrs ago   Sexual activity: Yes    Birth control/protection: Surgical  Other Topics Concern   Not on file  Social History Narrative   Not on file   Social Determinants of Health   Financial Resource Strain: Not on file  Food Insecurity: Not on file   Transportation Needs: Not on file  Physical Activity: Not on file  Stress: Not on file  Social Connections: Not on file  Intimate Partner Violence: Not on file    Physical Exam: Vital signs in last 24 hours: BP (!) 88/51   Pulse 76   Temp (!) 97.5 F (36.4 C)   Ht 5\' 3"  (1.6 m)   Wt 144 lb (65.3 kg)   LMP 04/18/2012   SpO2 98%   BMI 25.51 kg/m  GEN: NAD EYE: Sclerae anicteric ENT: MMM CV: Non-tachycardic Pulm: No increased WOB GI: Soft NEURO:  Alert & Oriented   [name], MD [facility]   08/12/2021 8:07 AM

## 2021-08-12 NOTE — Op Note (Signed)
[facility] Patient Name: Sheri Wyatt Procedure Date: 08/12/2021 7:24 AM MRN: [id number] Endoscopist: Sonny Masters "Sheri Wyatt ,  Age: 35 Referring MD:  Date of Birth: 03/31/1986 Gender: Female Account #: [id number] Procedure:                Colonoscopy Indications:              Rectal bleeding Medicines:                Monitored Anesthesia Care Procedure:                Pre-Anesthesia Assessment:                           - Prior to the procedure, a History and Physical                            was performed, and patient medications and                            allergies were reviewed. The patient's tolerance of                            previous anesthesia was also reviewed. The risks                            and benefits of the procedure and the sedation                            options and risks were discussed with the patient.                            All questions were answered, and informed consent                            was obtained. Prior Anticoagulants: The patient has                            taken no previous anticoagulant or antiplatelet                            agents. ASA Grade Assessment: II - A patient with                            mild systemic disease. After reviewing the risks                            and benefits, the patient was deemed in                            satisfactory condition to undergo the procedure.                           After obtaining informed consent, the colonoscope  was passed under direct vision. Throughout the                            procedure, the patient's blood pressure, pulse, and                            oxygen saturations were monitored continuously. The                            Olympus CF-HQ190L ([id number]) Colonoscope was                            introduced through the anus and advanced to the the                            terminal ileum. The colonoscopy was  performed                            without difficulty. The patient tolerated the                            procedure well. The quality of the bowel                            preparation was excellent. Scope In: 8:13:52 AM Scope Out: 8:33:46 AM Scope Withdrawal Time: 0 hours 13 minutes 30 seconds  Total Procedure Duration: 0 hours 19 minutes 54 seconds  Findings:                 The terminal ileum appeared normal.                           A localized area of mildly erythematous mucosa was                            found in the rectum. This was biopsied with a cold                            forceps for histology.                           Non-bleeding internal hemorrhoids were found during                            retroflexion. The hemorrhoids were small. Complications:            No immediate complications. Estimated Blood Loss:     Estimated blood loss was minimal. Impression:               - The examined portion of the ileum was normal.                           - Erythematous mucosa in the rectum. Biopsied.                           - Non-bleeding  internal hemorrhoids. Recommendation:           - Discharge patient to home (with escort).                           - High fiber diet.                           - Avoid NSAIDs.                           - Await pathology results.                           - The findings and recommendations were discussed                            with the patient. Sonny Masters "Sheri Wyatt,  08/12/2021 8:41:32 AM

## 2021-08-14 ENCOUNTER — Telehealth: Payer: Self-pay

## 2021-08-14 NOTE — Telephone Encounter (Signed)
Called the patient back. She states that she has had some LLQ abdominal discomfort after her procedure. This is in the same location as the discomfort that she has felt prior to her procedure. The discomfort feels slightly different than it did previously, but is not excessively bothersome. The pain comes and goes. She has been passing gas and stools. Denies bleeding, N&V, or fevers. I recommended she try taking some gas-X PRN and/or Tylenol PRN (up to 2000 mg per day) to help with the discomfort. If these are not effective, then could consider adding on Bentyl PRN. Will have her come back to clinic in 2 months to follow up and discuss her symptoms further.   Ammie, could you arrange for a follow up appt with me in 2 months?

## 2021-08-14 NOTE — Telephone Encounter (Signed)
Pt scheduled as follows:  Appointment Information  Name: Naveen, Lorusso MRN: [id number]  Date: 10/17/2021 Status: Sch  Time: 9:30 AM Length: 20  Visit Type: FOLLOW UP 20 [336] Copay: $0.00  Provider: Sharyn Creamer, MD Department: [facility]  Referring Provider: Sharilyn Sites CSN: [id number]  Notes: 16mo f/u abd pain/ae  Made On: 08/14/2021 4:45 PM By: Hardie Pulley, Sorayah Schrodt J

## 2021-08-14 NOTE — Telephone Encounter (Signed)
  Follow up Call-  Call back number 08/12/2021  Post procedure Call Back phone  # [phone]  Permission to leave phone message Yes  Some recent data might be hidden     Patient questions:  Do you have a fever, pain , or abdominal swelling? Yes.   Pain Score  5 *  Have you tolerated food without any problems? Yes.    Have you been able to return to your normal activities? Yes.    Do you have any questions about your discharge instructions: Diet   No. Medications  No. Follow up visit  No.  Do you have questions or concerns about your Care? Yes.    Actions: * If pain score is 4 or above: No action needed, pain <4.

## 2021-08-16 ENCOUNTER — Encounter: Payer: Self-pay | Admitting: Internal Medicine

## 2021-08-20 ENCOUNTER — Other Ambulatory Visit: Payer: Self-pay

## 2021-08-20 DIAGNOSIS — R1084 Generalized abdominal pain: Secondary | ICD-10-CM

## 2021-08-20 MED ORDER — DICYCLOMINE HCL 10 MG PO CAPS: 10.0000 mg | ORAL_CAPSULE | Freq: Four times a day (QID) | ORAL | 0 refills | Status: DC | PRN

## 2021-08-29 ENCOUNTER — Ambulatory Visit: Payer: 59 | Admitting: Gastroenterology

## 2021-09-10 ENCOUNTER — Other Ambulatory Visit: Payer: Self-pay

## 2021-09-10 ENCOUNTER — Ambulatory Visit
Admission: EM | Admit: 2021-09-10 | Discharge: 2021-09-10 | Disposition: A | Discharge status: Home / Self Care | Payer: 59 | Attending: Student | Admitting: Student

## 2021-09-10 DIAGNOSIS — J4521 Mild intermittent asthma with (acute) exacerbation: Secondary | ICD-10-CM | POA: Diagnosis not present

## 2021-09-10 DIAGNOSIS — H66001 Acute suppurative otitis media without spontaneous rupture of ear drum, right ear: Secondary | ICD-10-CM

## 2021-09-10 MED ORDER — AMOXICILLIN-POT CLAVULANATE 875-125 MG PO TABS: 1.0000 | ORAL_TABLET | Freq: Two times a day (BID) | ORAL | 0 refills | Status: DC

## 2021-09-10 MED ORDER — PREDNISONE 20 MG PO TABS: 40.0000 mg | ORAL_TABLET | Freq: Every day | ORAL | 0 refills | Status: AC

## 2021-09-10 MED ORDER — ALBUTEROL SULFATE (5 MG/ML) 0.5% IN NEBU: 2.5000 mg | INHALATION_SOLUTION | Freq: Four times a day (QID) | RESPIRATORY_TRACT | 5 refills | Status: DC | PRN

## 2021-09-10 NOTE — Discharge Instructions (Addendum)
-  Prednisone, 2 pills taken at the same time for 5 days in a row.  Try taking this earlier in the day as it can give you energy. Avoid NSAIDs like ibuprofen and alleve while taking this medication as they can increase your risk of stomach upset and even GI bleeding when in combination with a steroid. You can continue tylenol (acetaminophen) up to 1000mg  3x daily. -Start the antibiotic-Augmentin (amoxicillin-clavulanate), 1 pill every 12 hours for 7 days.  You can take this with food like with breakfast and dinner. -Albuterol inhaler as needed for cough, wheezing, shortness of breath, 1 to 2 puffs every 6 hours as needed, or nebulizer every 4 hours as needed.  -With a virus, you're typically contagious for 5-7 days, or as long as you're having fevers.

## 2021-09-10 NOTE — ED Provider Notes (Signed)
[facility]    CSN: [id number] Arrival date & time: 09/10/21  0855      History   Chief Complaint Chief Complaint  Patient presents with   Otalgia   Sore Throat   Cough    HPI Sheri Wyatt is a 35 y.o. female presenting with viral syndrome for 5 days, now with hoarse voice and right ear pain.  Medical history asthma, this is currently only moderately well controlled on albuterol inhaler only, she is out of nebulizer solution.  States that her daughter has similar symptoms but she does not know what her daughter has.  Initially with cough, fever/chills, body aches, sore throat for 5 days, now with right ear pain and hoarse voice for 1 day.  Denies hearing changes, dizziness, tinnitus.  Albuterol inhaler provides some relief, but she states she thinks she may need the nebulizer.  Has not monitored temperature at home.  HPI  Past Medical History:  Diagnosis Date   Anxiety    Asthma    Interstitial cystitis    Melanoma ([place])    Melanoma ([place])     There are no problems to display for this patient.   Past Surgical History:  Procedure Laterality Date   NORPLANT REMOVAL  04/28/2012   Procedure: REMOVAL OF NORPLANT;  Surgeon: Florian Buff, MD;  Location: [facility];  Service: Gynecology;  Laterality: N/A;  removal of implanon-started at [place]     as child   VAGINAL HYSTERECTOMY  04/28/2012   Procedure: HYSTERECTOMY VAGINAL;  Surgeon: Florian Buff, MD;  Location: [facility];  Service: Gynecology;  Laterality: N/A;    OB History     Gravida  1   Para  1   Term  1   Preterm      AB      Living  1      SAB      IAB      Ectopic      Multiple      Live Births               Home Medications    Prior to Admission medications   Medication Sig Start Date End Date Taking? Authorizing Provider  albuterol (PROVENTIL) (5 MG/ML) 0.5% nebulizer solution Take 0.5 mLs (2.5 mg total) by nebulization every 6 (six) hours as needed for  wheezing or shortness of breath. 09/10/21  Yes Hazel Sams, PA-C  amoxicillin-clavulanate (AUGMENTIN) 875-125 MG tablet Take 1 tablet by mouth every 12 (twelve) hours. 09/10/21  Yes Hazel Sams, PA-C  predniSONE (DELTASONE) 20 MG tablet Take 2 tablets (40 mg total) by mouth daily for 5 days. Take with breakfast or lunch. Avoid NSAIDs (ibuprofen, etc) while taking this medication. 09/10/21 09/15/21 Yes Hazel Sams, PA-C  cetirizine (ZYRTEC) 10 MG chewable tablet Chew 10 mg by mouth daily.    [provider]  dicyclomine (BENTYL) 10 MG capsule Take 1 capsule (10 mg total) by mouth 4 (four) times daily as needed for spasms. 08/20/21   Sharyn Creamer, MD  hydrocortisone (ANUSOL-HC) 25 MG suppository Place 1 suppository (25 mg total) rectally 2 (two) times daily. For seven days 08/12/21   Sharyn Creamer, MD  URELLE (URELLE/URISED) 81 MG TABS tablet Take 1 tablet (81 mg total) by mouth 4 (four) times daily. 01/18/15   Florian Buff, MD  valACYclovir (VALTREX) 500 MG tablet Take 500 tablets by mouth daily as needed. 06/12/21  [provider]    Family History Family History  Problem Relation Age of Onset   Colon polyps Mother    Cancer Mother        melanoma   Hypertension Mother    Heart disease Maternal Grandfather    Seizures Maternal Grandfather        mini   Cancer Paternal Grandfather        prostate    Asthma Daughter    Stomach cancer Other    Rectal cancer Other    Esophageal cancer Other    Colon cancer Other     Social History Social History   Tobacco Use   Smoking status: Former    Packs/day: 0.25    Years: 1.50    Pack years: 0.38    Types: Cigarettes    Quit date: 04/22/2003    Years since quitting: 18.4   Smokeless tobacco: Never  Vaping Use   Vaping Use: Never used  Substance Use Topics   Alcohol use: Yes    Alcohol/week: 0.0 standard drinks    Comment: occassional   Drug use: No    Comment: cocoaine- and ectasy 8 yrs ago      Allergies   Corn-containing products   Review of Systems Review of Systems  Constitutional:  Negative for appetite change, chills and fever.  HENT:  Positive for congestion and ear pain. Negative for rhinorrhea, sinus pressure, sinus pain and sore throat.   Eyes:  Negative for redness and visual disturbance.  Respiratory:  Positive for cough. Negative for chest tightness, shortness of breath and wheezing.   Cardiovascular:  Negative for chest pain and palpitations.  Gastrointestinal:  Negative for abdominal pain, constipation, diarrhea, nausea and vomiting.  Genitourinary:  Negative for dysuria, frequency and urgency.  Musculoskeletal:  Negative for myalgias.  Neurological:  Negative for dizziness, weakness and headaches.  Psychiatric/Behavioral:  Negative for confusion.   All other systems reviewed and are negative.   Physical Exam Triage Vital Signs ED Triage Vitals  Enc Vitals Group     BP 09/10/21 1219 121/72     Pulse Rate 09/10/21 1219 76     Resp 09/10/21 1219 20     Temp 09/10/21 1219 98.6 F (37 C)     Temp src --      SpO2 09/10/21 1219 99 %     Weight --      Height --      Head Circumference --      Peak Flow --      Pain Score 09/10/21 1221 4     Pain Loc --      Pain Edu? --      Excl. in [place]? --    No data found.  Updated Vital Signs BP 121/72   Pulse 76   Temp 98.6 F (37 C)   Resp 20   LMP 04/18/2012   SpO2 99%   Visual Acuity Right Eye Distance:   Left Eye Distance:   Bilateral Distance:    Right Eye Near:   Left Eye Near:    Bilateral Near:     Physical Exam Vitals reviewed.  Constitutional:      General: She is not in acute distress.    Appearance: Normal appearance. She is not ill-appearing.  HENT:     Head: Normocephalic and atraumatic.     Right Ear: Ear canal and external ear normal. No tenderness. No middle ear effusion. There is no impacted cerumen. Tympanic membrane is  retracted. Tympanic membrane is not perforated,  erythematous or bulging.     Left Ear: Tympanic membrane, ear canal and external ear normal. No tenderness.  No middle ear effusion. There is no impacted cerumen. Tympanic membrane is not perforated, erythematous, retracted or bulging.     Nose: Nose normal. No congestion.     Mouth/Throat:     Mouth: Mucous membranes are moist.     Pharynx: Uvula midline. No oropharyngeal exudate or posterior oropharyngeal erythema.  Eyes:     Extraocular Movements: Extraocular movements intact.     Pupils: Pupils are equal, round, and reactive to light.  Cardiovascular:     Rate and Rhythm: Normal rate and regular rhythm.     Heart sounds: Normal heart sounds.  Pulmonary:     Effort: Pulmonary effort is normal.     Breath sounds: Normal breath sounds. No decreased breath sounds, wheezing, rhonchi or rales.  Abdominal:     Palpations: Abdomen is soft.     Tenderness: There is no abdominal tenderness. There is no guarding or rebound.  Lymphadenopathy:     Cervical: No cervical adenopathy.     Right cervical: No superficial cervical adenopathy.    Left cervical: No superficial cervical adenopathy.  Neurological:     General: No focal deficit present.     Mental Status: She is alert and oriented to person, place, and time.  Psychiatric:        Mood and Affect: Mood normal.        Behavior: Behavior normal.        Thought Content: Thought content normal.        Judgment: Judgment normal.     UC Treatments / Results  Labs (all labs ordered are listed, but only abnormal results are displayed) Labs Reviewed - No data to display  EKG   Radiology No results found.  Procedures Procedures (including critical care time)  Medications Ordered in UC Medications - No data to display  Initial Impression / Assessment and Plan / UC Course  I have reviewed the triage vital signs and the nursing notes.  Pertinent labs & imaging results that were available during my care of the patient were reviewed  by me and considered in my medical decision making (see chart for details).     This patient is a very pleasant 35 y.o. year old female presenting with asthma exacerbation and R AOM following viral URI. Today this pt is afebrile nontachycardic nontachypneic, oxygenating well on room air, no wheezes rhonchi or rales. Declines covid/influenza testing. Hysterectomy for contraception.  Augmentin, prednisone, albuterol nebulizer solution sent.   ED return precautions discussed. Patient verbalizes understanding and agreement.   Level 4 for acute exacerbation of chronic condition and prescription drug management.  Final Clinical Impressions(s) / UC Diagnoses   Final diagnoses:  Non-recurrent acute suppurative otitis media of right ear without spontaneous rupture of tympanic membrane  Mild intermittent asthma with acute exacerbation     Discharge Instructions      -Prednisone, 2 pills taken at the same time for 5 days in a row.  Try taking this earlier in the day as it can give you energy. Avoid NSAIDs like ibuprofen and alleve while taking this medication as they can increase your risk of stomach upset and even GI bleeding when in combination with a steroid. You can continue tylenol (acetaminophen) up to 1000mg  3x daily. -Start the antibiotic-Augmentin (amoxicillin-clavulanate), 1 pill every 12 hours for 7 days.  You can take this  with food like with breakfast and dinner. -Albuterol inhaler as needed for cough, wheezing, shortness of breath, 1 to 2 puffs every 6 hours as needed, or nebulizer every 4 hours as needed.  -With a virus, you're typically contagious for 5-7 days, or as long as you're having fevers.      ED Prescriptions     Medication Sig Dispense Auth. Provider   predniSONE (DELTASONE) 20 MG tablet Take 2 tablets (40 mg total) by mouth daily for 5 days. Take with breakfast or lunch. Avoid NSAIDs (ibuprofen, etc) while taking this medication. 10 tablet Hazel Sams, PA-C    albuterol (PROVENTIL) (5 MG/ML) 0.5% nebulizer solution Take 0.5 mLs (2.5 mg total) by nebulization every 6 (six) hours as needed for wheezing or shortness of breath. 20 mL Marin Roberts E, PA-C   amoxicillin-clavulanate (AUGMENTIN) 875-125 MG tablet Take 1 tablet by mouth every 12 (twelve) hours. 14 tablet Hazel Sams, PA-C      PDMP not reviewed this encounter.   Hazel Sams, PA-C 09/10/21 1302

## 2021-09-10 NOTE — ED Triage Notes (Signed)
Pt presents with c/o right side ear pain , cough and facial swelling that began on Friday , lost voice yesterday

## 2021-09-19 ENCOUNTER — Ambulatory Visit: Payer: 59 | Admitting: Obstetrics & Gynecology

## 2021-10-16 NOTE — Progress Notes (Deleted)
Chief Complaint: Abdominal bloating and elevated LFTs  HPI : 35 year old female with history of asthma, anxiety, and melanoma (in remission) presents with abdominal bloating and elevated LFTs  She has had issues with abdominal bloating over the last 3 months. She also has had issues with rectal discomfort and pain in the LLQ. Yesterday she felt nauseous and felt like she was going to pass out. She has been able to maintain good PO intake. Occasional nausea but denies vomiting. Having one BM per day but feels that it is less stool than normal. Has seen occasional small amounts of bright red blood in her stool. Denies black stools. Denies dysphagia. Denies unintentional weight loss. Denies fam hx of liver issues. Has multiple great grandparents with colon cancer. Mother had colon polyps, unclear what kind. She has history of hemorrhoids. Has had one daughter via vaginal delivery. Has had a partial hysterectomy in the past. Used to take Optavia weight loss supplement (last took 6 months ago). 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 Has never been told that she has had liver issues prior to 3 months ago.  She was recently seen in urgent care on 05/10/21 and in the ED on 05/12/21 with abdominal discomfort and bloating.  She had a UA and urine pregnancy test that were unremarkable.  CBC unremarkable.  CMP was only notable for a mildly elevated ALT of 62, other LFTs normal.  Lipase normal.  CT A/P with contrast did not reveal a source of her abdominal discomfort. She was given a GI cocktail with some mild improvement in her bloating.   Interval History: ***   Past Medical History:  Diagnosis Date   Anxiety    Asthma    Interstitial cystitis    Melanoma ([place])    Melanoma ([place])      Past Surgical History:  Procedure Laterality Date   NORPLANT REMOVAL  04/28/2012   Procedure: REMOVAL OF NORPLANT;  Surgeon: Florian Buff, MD;  Location: [facility];   Service: Gynecology;  Laterality: N/A;  removal of implanon-started at [place]     as child   VAGINAL HYSTERECTOMY  04/28/2012   Procedure: HYSTERECTOMY VAGINAL;  Surgeon: Florian Buff, MD;  Location: [facility];  Service: Gynecology;  Laterality: N/A;   Family History  Problem Relation Age of Onset   Colon polyps Mother    Cancer Mother        melanoma   Hypertension Mother    Heart disease Maternal Grandfather    Seizures Maternal Grandfather        mini   Cancer Paternal Grandfather        prostate    Asthma Daughter    Stomach cancer Other    Rectal cancer Other    Esophageal cancer Other    Colon cancer Other    Social History   Tobacco Use   Smoking status: Former    Packs/day: 0.25    Years: 1.50    Pack years: 0.38    Types: Cigarettes    Quit date: 04/22/2003    Years since quitting: 18.4   Smokeless tobacco: Never  Vaping Use   Vaping Use: Never used  Substance Use Topics   Alcohol use: Yes    Alcohol/week: 0.0 standard drinks    Comment: occassional   Drug use: No    Comment: cocoaine- and ectasy 8 yrs ago   Current Outpatient Medications  Medication Sig Dispense Refill   albuterol (PROVENTIL) (5 MG/ML) 0.5% nebulizer solution Take 0.5 mLs (2.5 mg total) by nebulization every 6 (six) hours as needed for wheezing or shortness of breath. 20 mL 5   amoxicillin-clavulanate (AUGMENTIN) 875-125 MG tablet Take 1 tablet by mouth every 12 (twelve) hours. 14 tablet 0   cetirizine (ZYRTEC) 10 MG chewable tablet Chew 10 mg by mouth daily.     dicyclomine (BENTYL) 10 MG capsule Take 1 capsule (10 mg total) by mouth 4 (four) times daily as needed for spasms. 30 capsule 0   hydrocortisone (ANUSOL-HC) 25 MG suppository Place 1 suppository (25 mg total) rectally 2 (two) times daily. For seven days 14 suppository 0   URELLE (URELLE/URISED) 81 MG TABS tablet Take 1 tablet (81 mg total) by mouth 4 (four) times daily. 120 each 4   valACYclovir (VALTREX) 500 MG tablet  Take 500 tablets by mouth daily as needed.     No current facility-administered medications for this visit.   Allergies  Allergen Reactions   Corn-Containing Products Hives and Itching    REACTION: Sneezing     Review of Systems: All systems reviewed and negative except where noted in HPI.   Physical Exam: LMP 04/18/2012  Constitutional: Pleasant,well-developed, female in no acute distress. HEENT: Normocephalic and atraumatic. Conjunctivae are normal. No scleral icterus. Cardiovascular: Normal rate Pulmonary/chest: No increased WOB Abdominal: Soft, nondistended, mildly tender in the LLQ, epigastric, and suprapublic areas. There are no masses palpable. No hepatomegaly. Extremities: No edema Neurological: Alert and oriented to person place and time. Skin: Skin is warm and dry. No rashes noted. Psychiatric: Normal mood and affect. Behavior is normal.  Labs 05/12/21: CBC unremarkable. CMP with AST 34, ALT 62, alk phos 55, T bili 0.5. Lipase nml.  Labs 05/24/21: CMP with AST 42, ALT 77, Alk phos 60, T bili 0.3  Labs 07/2021: INR 1.1, Hep A ab neg, HBSAb positive, HBSAg negative, HCV Ab neg, ANA 1:40, ASMA neg, IgG nml, AMA neg, iron sat mildly decreased, A1AT unremarkable, ceruloplasmin nml, TTG IgA neg, IgA nml  CT A/P w/contrast 05/12/21: IMPRESSION: No definite abnormality seen in the abdomen or pelvis  Colonoscopy 08/12/21: - The examined portion of the ileum was normal. - Erythematous mucosa in the rectum. Biopsied. - Non-bleeding internal hemorrhoids. Path: - RECTAL MUCOSA WITH NO SPECIFIC HISTOPATHOLOGIC CHANGES - NEGATIVE FOR ACUTE INFLAMMATION, FEATURES OF CHRONICITY, GRANULOMAS OR DYSPLASIA  ASSESSMENT AND PLAN: Abdominal pain Bloating Hemorrhoids NAFLD Patient presents with 3 months of abdominal bloating and discomfort.  She has also noted rectal bleeding and rectal pain.  During an ED visit and subsequent clinic follow-up for these GI issues, she was noted  incidentally to have a mild transaminitis. Per my independent review, her last CT A/P showed significant stool burden throughout the colon.  I suspect that many of her symptoms are due to constipation issues.  Her constipation is likely leading to an exacerbation of hemorrhoidal bleeding.  However, with the patient's family history of colon cancer and colon polyps, would be reasonable to rule out alternative causes of rectal bleeding by performing colonoscopy.  I offered the patient the option of performing a rectal exam or opting for colonoscopy.  Patient expressed concerns about rectal discomfort with a rectal exam (since she was already having some rectal pain) so we will proceed with a colonoscopy, which will allow for examination under sedation  Will start her on basic constipation therapies prior to her colonoscopy.  For her elevated LFTs, we  will plan for comprehensive work-up due to her young age and the fact that she has no obvious risk factors for elevated LFTs - Encourage hydration and ambulation - Encourage Miralax QD, uptitrate as needed - Encourage weight loss - Offer nutritionist - Needs Hep A vaccination  [name], MD

## 2021-10-17 ENCOUNTER — Ambulatory Visit: Payer: 59 | Admitting: Internal Medicine

## 2021-10-24 DIAGNOSIS — S134XXA Sprain of ligaments of cervical spine, initial encounter: Secondary | ICD-10-CM | POA: Diagnosis not present

## 2021-10-24 DIAGNOSIS — S233XXA Sprain of ligaments of thoracic spine, initial encounter: Secondary | ICD-10-CM | POA: Diagnosis not present

## 2021-10-24 DIAGNOSIS — S338XXA Sprain of other parts of lumbar spine and pelvis, initial encounter: Secondary | ICD-10-CM | POA: Diagnosis not present

## 2021-11-06 ENCOUNTER — Encounter: Payer: Self-pay | Admitting: Internal Medicine

## 2021-11-14 DIAGNOSIS — S134XXA Sprain of ligaments of cervical spine, initial encounter: Secondary | ICD-10-CM | POA: Diagnosis not present

## 2021-11-14 DIAGNOSIS — S338XXA Sprain of other parts of lumbar spine and pelvis, initial encounter: Secondary | ICD-10-CM | POA: Diagnosis not present

## 2021-11-14 DIAGNOSIS — S233XXA Sprain of ligaments of thoracic spine, initial encounter: Secondary | ICD-10-CM | POA: Diagnosis not present

## 2021-12-11 DIAGNOSIS — S233XXA Sprain of ligaments of thoracic spine, initial encounter: Secondary | ICD-10-CM | POA: Diagnosis not present

## 2021-12-11 DIAGNOSIS — S134XXA Sprain of ligaments of cervical spine, initial encounter: Secondary | ICD-10-CM | POA: Diagnosis not present

## 2021-12-11 DIAGNOSIS — S338XXA Sprain of other parts of lumbar spine and pelvis, initial encounter: Secondary | ICD-10-CM | POA: Diagnosis not present

## 2022-01-16 DIAGNOSIS — S338XXA Sprain of other parts of lumbar spine and pelvis, initial encounter: Secondary | ICD-10-CM | POA: Diagnosis not present

## 2022-01-16 DIAGNOSIS — S233XXA Sprain of ligaments of thoracic spine, initial encounter: Secondary | ICD-10-CM | POA: Diagnosis not present

## 2022-01-16 DIAGNOSIS — S134XXA Sprain of ligaments of cervical spine, initial encounter: Secondary | ICD-10-CM | POA: Diagnosis not present

## 2022-02-07 DIAGNOSIS — S134XXA Sprain of ligaments of cervical spine, initial encounter: Secondary | ICD-10-CM | POA: Diagnosis not present

## 2022-02-07 DIAGNOSIS — S338XXA Sprain of other parts of lumbar spine and pelvis, initial encounter: Secondary | ICD-10-CM | POA: Diagnosis not present

## 2022-02-07 DIAGNOSIS — S233XXA Sprain of ligaments of thoracic spine, initial encounter: Secondary | ICD-10-CM | POA: Diagnosis not present

## 2022-02-21 ENCOUNTER — Ambulatory Visit
Admission: RE | Admit: 2022-02-21 | Discharge: 2022-02-21 | Disposition: A | Discharge status: Home / Self Care | Payer: 59 | Source: Ambulatory Visit | Attending: Student | Admitting: Student

## 2022-02-21 VITALS — BP 97/63 | HR 72 | Temp 98.2°F | Resp 18

## 2022-02-21 DIAGNOSIS — B349 Viral infection, unspecified: Secondary | ICD-10-CM | POA: Diagnosis not present

## 2022-02-21 DIAGNOSIS — J4521 Mild intermittent asthma with (acute) exacerbation: Secondary | ICD-10-CM

## 2022-02-21 MED ORDER — PREDNISONE 10 MG (21) PO TBPK: ORAL_TABLET | Freq: Every day | ORAL | 0 refills | Status: DC

## 2022-02-21 MED ORDER — ALBUTEROL SULFATE (5 MG/ML) 0.5% IN NEBU: 2.5000 mg | INHALATION_SOLUTION | Freq: Four times a day (QID) | RESPIRATORY_TRACT | 5 refills | Status: DC | PRN

## 2022-02-21 MED ORDER — PROMETHAZINE-DM 6.25-15 MG/5ML PO SYRP: 5.0000 mL | ORAL_SOLUTION | Freq: Four times a day (QID) | ORAL | 0 refills | Status: DC | PRN

## 2022-02-21 NOTE — ED Provider Notes (Signed)
?[facility] ? ? ? ?CSN: [id number] ?Arrival date & time: 02/21/22  1159 ? ? ?  ? ?History   ?Chief Complaint ?Chief Complaint  ?Patient presents with  ? Cough  ?  Asthma has been triggered after having a sore throat. - Entered by patient  ? ? ?HPI ?Sheri Wyatt is a 36 y.o. female presenting with asthma flare related to viral syndrome. History asthma, typically controlled on prn albuterol. Describes 3 days of nonproductive cough, improved with albuterol nebulizer q4h. Also with sore throat. Denies SOB, CP, dizziness, weakness. States she took some tussionex she had at home with relief. ? ?HPI ? ?Past Medical History:  ?Diagnosis Date  ? Anxiety   ? Asthma   ? Interstitial cystitis   ? Melanoma ([place])   ? Melanoma ([place])   ? ? ?There are no problems to display for this patient. ? ? ?Past Surgical History:  ?Procedure Laterality Date  ? NORPLANT REMOVAL  04/28/2012  ? Procedure: REMOVAL OF NORPLANT;  Surgeon: Florian Buff, MD;  Location: [facility];  Service: Gynecology;  Laterality: N/A;  removal of implanon-started at [facility]  ? TONGUE SURGERY    ? as child  ? VAGINAL HYSTERECTOMY  04/28/2012  ? Procedure: HYSTERECTOMY VAGINAL;  Surgeon: Florian Buff, MD;  Location: [facility];  Service: Gynecology;  Laterality: N/A;  ? ? ?OB History   ? ? Gravida  ?1  ? Para  ?1  ? Term  ?1  ? Preterm  ?   ? AB  ?   ? Living  ?1  ?  ? ? SAB  ?   ? IAB  ?   ? Ectopic  ?   ? Multiple  ?   ? Live Births  ?   ?   ?  ?  ? ? ? ?Home Medications   ? ?Prior to Admission medications   ?Medication Sig Start Date End Date Taking? Authorizing Provider  ?predniSONE (STERAPRED UNI-PAK 21 TAB) 10 MG (21) TBPK tablet Take by mouth daily. Take 6 tabs by mouth daily  for 2 days, then 5 tabs for 2 days, then 4 tabs for 2 days, then 3 tabs for 2 days, 2 tabs for 2 days, then 1 tab by mouth daily for 2 days 02/21/22  Yes Hazel Sams, PA-C  ?promethazine-dextromethorphan (PROMETHAZINE-DM) 6.25-15 MG/5ML syrup Take 5 mLs by mouth 4 (four) times daily  as needed for cough. 02/21/22  Yes Hazel Sams, PA-C  ?albuterol (PROVENTIL) (5 MG/ML) 0.5% nebulizer solution Take 0.5 mLs (2.5 mg total) by nebulization every 6 (six) hours as needed for wheezing or shortness of breath. 02/21/22   Hazel Sams, PA-C  ?cetirizine (ZYRTEC) 10 MG chewable tablet Chew 10 mg by mouth daily.    [provider]  ?dicyclomine (BENTYL) 10 MG capsule Take 1 capsule (10 mg total) by mouth 4 (four) times daily as needed for spasms. 08/20/21   Sharyn Creamer, MD  ?hydrocortisone (ANUSOL-HC) 25 MG suppository Place 1 suppository (25 mg total) rectally 2 (two) times daily. For seven days 08/12/21   Sharyn Creamer, MD  ?URELLE (URELLE/URISED) 81 MG TABS tablet Take 1 tablet (81 mg total) by mouth 4 (four) times daily. 01/18/15   Florian Buff, MD  ?valACYclovir (VALTREX) 500 MG tablet Take 500 tablets by mouth daily as needed. 06/12/21   [provider]  ? ? ?Family History ?Family History  ?Problem Relation Age of Onset  ? Colon polyps  Mother   ? Cancer Mother   ?     melanoma  ? Hypertension Mother   ? Heart disease Maternal Grandfather   ? Seizures Maternal Grandfather   ?     mini  ? Cancer Paternal Grandfather   ?     prostate   ? Asthma Daughter   ? Stomach cancer Other   ? Rectal cancer Other   ? Esophageal cancer Other   ? Colon cancer Other   ? ? ?Social History ?Social History  ? ?Tobacco Use  ? Smoking status: Former  ?  Packs/day: 0.25  ?  Years: 1.50  ?  Pack years: 0.38  ?  Types: Cigarettes  ?  Quit date: 04/22/2003  ?  Years since quitting: 18.8  ? Smokeless tobacco: Never  ?Vaping Use  ? Vaping Use: Never used  ?Substance Use Topics  ? Alcohol use: Yes  ?  Alcohol/week: 0.0 standard drinks  ?  Comment: occassional  ? Drug use: No  ?  Comment: cocoaine- and ectasy 8 yrs ago  ? ? ? ?Allergies   ?Corn-containing products ? ? ?Review of Systems ?Review of Systems  ?Constitutional:  Negative for appetite change, chills and fever.  ?HENT:  Positive for congestion.  Negative for ear pain, rhinorrhea, sinus pressure, sinus pain and sore throat.   ?Eyes:  Negative for redness and visual disturbance.  ?Respiratory:  Positive for cough. Negative for chest tightness, shortness of breath and wheezing.   ?Cardiovascular:  Negative for chest pain and palpitations.  ?Gastrointestinal:  Negative for abdominal pain, constipation, diarrhea, nausea and vomiting.  ?Genitourinary:  Negative for dysuria, frequency and urgency.  ?Musculoskeletal:  Negative for myalgias.  ?Neurological:  Negative for dizziness, weakness and headaches.  ?Psychiatric/Behavioral:  Negative for confusion.   ?All other systems reviewed and are negative. ? ? ?Physical Exam ?Triage Vital Signs ?ED Triage Vitals  ?Enc Vitals Group  ?   BP 02/21/22 1229 97/63  ?   Pulse Rate 02/21/22 1229 72  ?   Resp 02/21/22 1229 18  ?   Temp 02/21/22 1229 98.2 ?F (36.8 ?C)  ?   Temp Source 02/21/22 1229 Oral  ?   SpO2 02/21/22 1229 97 %  ?   Weight --   ?   Height --   ?   Head Circumference --   ?   Peak Flow --   ?   Pain Score 02/21/22 1227 3  ?   Pain Loc --   ?   Pain Edu? --   ?   Excl. in [place]? --   ? ?No data found. ? ?Updated Vital Signs ?BP 97/63 (BP Location: Right Arm)   Pulse 72   Temp 98.2 ?F (36.8 ?C) (Oral)   Resp 18   LMP 04/18/2012   SpO2 97%  ? ?Visual Acuity ?Right Eye Distance:   ?Left Eye Distance:   ?Bilateral Distance:   ? ?Right Eye Near:   ?Left Eye Near:    ?Bilateral Near:    ? ?Physical Exam ?Vitals reviewed.  ?Constitutional:   ?   General: She is not in acute distress. ?   Appearance: Normal appearance. She is not ill-appearing.  ?HENT:  ?   Head: Normocephalic and atraumatic.  ?   Right Ear: Tympanic membrane, ear canal and external ear normal. No tenderness. No middle ear effusion. There is no impacted cerumen. Tympanic membrane is not perforated, erythematous, retracted or bulging.  ?   Left Ear: Tympanic  membrane, ear canal and external ear normal. No tenderness.  No middle ear effusion. There is  no impacted cerumen. Tympanic membrane is not perforated, erythematous, retracted or bulging.  ?   Nose: Nose normal. No congestion.  ?   Mouth/Throat:  ?   Mouth: Mucous membranes are moist.  ?   Pharynx: Uvula midline. No oropharyngeal exudate or posterior oropharyngeal erythema.  ?Eyes:  ?   Extraocular Movements: Extraocular movements intact.  ?   Pupils: Pupils are equal, round, and reactive to light.  ?Cardiovascular:  ?   Rate and Rhythm: Normal rate and regular rhythm.  ?   Heart sounds: Normal heart sounds.  ?Pulmonary:  ?   Effort: Pulmonary effort is normal.  ?   Breath sounds: Normal breath sounds. No decreased breath sounds, wheezing, rhonchi or rales.  ?   Comments: Freq cough  ?Abdominal:  ?   Palpations: Abdomen is soft.  ?   Tenderness: There is no abdominal tenderness. There is no guarding or rebound.  ?Lymphadenopathy:  ?   Cervical: No cervical adenopathy.  ?   Right cervical: No superficial cervical adenopathy. ?   Left cervical: No superficial cervical adenopathy.  ?Neurological:  ?   General: No focal deficit present.  ?   Mental Status: She is alert and oriented to person, place, and time.  ?Psychiatric:     ?   Mood and Affect: Mood normal.     ?   Behavior: Behavior normal.     ?   Thought Content: Thought content normal.     ?   Judgment: Judgment normal.  ? ? ? ?UC Treatments / Results  ?Labs ?(all labs ordered are listed, but only abnormal results are displayed) ?Labs Reviewed - No data to display ? ?EKG ? ? ?Radiology ?No results found. ? ?Procedures ?Procedures (including critical care time) ? ?Medications Ordered in UC ?Medications - No data to display ? ?Initial Impression / Assessment and Plan / UC Course  ?I have reviewed the triage vital signs and the nursing notes. ? ?Pertinent labs & imaging results that were available during my care of the patient were reviewed by me and considered in my medical decision making (see chart for details). ? ?  ? ?This patient is a very pleasant 36  y.o. year old female presenting with viral syndrome and asthma exacerbation.  Afebrile, nontachycardic, no adventitious breath sounds.  Nontoxic-appearing.  Hysterectomy for contraception. ? ?We will manage

## 2022-02-21 NOTE — Discharge Instructions (Addendum)
-  Albuterol inhaler or nebulizer up to every 4 hours ?-Prednisone taper for cough/bronchitis. I recommend taking this in the morning as it could give you energy.  Avoid NSAIDs like ibuprofen and alleve while taking this medication as they can increase your risk of stomach upset and even GI bleeding when in combination with a steroid. You can continue tylenol (acetaminophen) up to '1000mg'$  3x daily. ?-Promethazine DM cough syrup for congestion/cough. This could make you drowsy, so take at night before bed. ?-You can also try Mucinex or similar during the day  ?-With a virus, you're typically contagious for 5-7 days, or as long as you're having fevers.  ? ? ?

## 2022-02-21 NOTE — ED Triage Notes (Signed)
Pt states that since Wednesday she has a sore throat ? ?Pt states that today her throat feels better but she started coughing and used her nebulizer and some old cough meds she had in the cabinet ? ?Pt states she has a headache on the left side of her face ? ?Denies Fever ?

## 2022-03-06 DIAGNOSIS — S338XXA Sprain of other parts of lumbar spine and pelvis, initial encounter: Secondary | ICD-10-CM | POA: Diagnosis not present

## 2022-03-06 DIAGNOSIS — S134XXA Sprain of ligaments of cervical spine, initial encounter: Secondary | ICD-10-CM | POA: Diagnosis not present

## 2022-03-06 DIAGNOSIS — S233XXA Sprain of ligaments of thoracic spine, initial encounter: Secondary | ICD-10-CM | POA: Diagnosis not present

## 2022-04-03 DIAGNOSIS — S233XXA Sprain of ligaments of thoracic spine, initial encounter: Secondary | ICD-10-CM | POA: Diagnosis not present

## 2022-04-03 DIAGNOSIS — S338XXA Sprain of other parts of lumbar spine and pelvis, initial encounter: Secondary | ICD-10-CM | POA: Diagnosis not present

## 2022-04-03 DIAGNOSIS — S134XXA Sprain of ligaments of cervical spine, initial encounter: Secondary | ICD-10-CM | POA: Diagnosis not present

## 2022-04-12 ENCOUNTER — Other Ambulatory Visit: Payer: Self-pay

## 2022-04-12 ENCOUNTER — Ambulatory Visit
Admission: EM | Admit: 2022-04-12 | Discharge: 2022-04-12 | Disposition: A | Discharge status: Home / Self Care | Payer: 59 | Attending: Nurse Practitioner | Admitting: Nurse Practitioner

## 2022-04-12 ENCOUNTER — Encounter: Payer: Self-pay | Admitting: Emergency Medicine

## 2022-04-12 DIAGNOSIS — J4531 Mild persistent asthma with (acute) exacerbation: Secondary | ICD-10-CM

## 2022-04-12 DIAGNOSIS — J019 Acute sinusitis, unspecified: Secondary | ICD-10-CM | POA: Diagnosis not present

## 2022-04-12 DIAGNOSIS — B9789 Other viral agents as the cause of diseases classified elsewhere: Secondary | ICD-10-CM | POA: Diagnosis not present

## 2022-04-12 MED ORDER — PREDNISONE 10 MG (21) PO TBPK: ORAL_TABLET | Freq: Every day | ORAL | 0 refills | Status: DC

## 2022-04-12 MED ORDER — FLUTICASONE PROPIONATE 50 MCG/ACT NA SUSP: 2.0000 | Freq: Every day | NASAL | 0 refills | Status: DC

## 2022-04-12 MED ORDER — PROMETHAZINE-DM 6.25-15 MG/5ML PO SYRP
5.0000 mL | ORAL_SOLUTION | Freq: Four times a day (QID) | ORAL | 0 refills | Status: DC | PRN
Start: 2022-04-12 — End: 2022-12-09

## 2022-04-12 MED ORDER — MONTELUKAST SODIUM 10 MG PO TABS: 10.0000 mg | ORAL_TABLET | Freq: Every day | ORAL | 0 refills | Status: DC

## 2022-04-12 NOTE — ED Triage Notes (Signed)
Patient c/o nonproductive cough x 2 days.   Patient denies fever.   Patient endorses SOB when " talking a lot or when I get hot".   Patient endorses frontal sinus pain.   Patient has history of Asthma.   Patient has used "inhaler" at home with no relief of symptoms.

## 2022-04-16 ENCOUNTER — Ambulatory Visit
Admission: EM | Admit: 2022-04-16 | Discharge: 2022-04-16 | Disposition: A | Discharge status: Home / Self Care | Payer: 59 | Attending: Nurse Practitioner | Admitting: Nurse Practitioner

## 2022-04-16 ENCOUNTER — Ambulatory Visit (INDEPENDENT_AMBULATORY_CARE_PROVIDER_SITE_OTHER): Payer: 59

## 2022-04-16 ENCOUNTER — Other Ambulatory Visit: Payer: Self-pay

## 2022-04-16 ENCOUNTER — Ambulatory Visit: Payer: Self-pay

## 2022-04-16 ENCOUNTER — Encounter: Payer: Self-pay | Admitting: Emergency Medicine

## 2022-04-16 DIAGNOSIS — J4541 Moderate persistent asthma with (acute) exacerbation: Secondary | ICD-10-CM | POA: Diagnosis not present

## 2022-04-16 DIAGNOSIS — R059 Cough, unspecified: Secondary | ICD-10-CM

## 2022-04-16 DIAGNOSIS — J209 Acute bronchitis, unspecified: Secondary | ICD-10-CM

## 2022-04-16 MED ORDER — HYDROCODONE BIT-HOMATROP MBR 5-1.5 MG/5ML PO SOLN: 5.0000 mL | Freq: Four times a day (QID) | ORAL | 0 refills | Status: DC | PRN

## 2022-04-16 MED ORDER — ALBUTEROL SULFATE HFA 108 (90 BASE) MCG/ACT IN AERS: 2.0000 | INHALATION_SPRAY | Freq: Four times a day (QID) | RESPIRATORY_TRACT | 0 refills | Status: AC | PRN

## 2022-04-16 MED ORDER — PREDNISONE 10 MG (21) PO TBPK: ORAL_TABLET | Freq: Every day | ORAL | 0 refills | Status: DC

## 2022-04-16 MED ORDER — AMOXICILLIN-POT CLAVULANATE 875-125 MG PO TABS: 1.0000 | ORAL_TABLET | Freq: Two times a day (BID) | ORAL | 0 refills | Status: DC

## 2022-04-16 NOTE — ED Provider Notes (Addendum)
[facility]    CSN: [id number] Arrival date & time: 04/16/22  0910      History   Chief Complaint Chief Complaint  Patient presents with   Cough    Entered by patient    HPI Sheri Wyatt is a 36 y.o. female.   The history is provided by the patient.    Patient presents for complaints of cough.  Patient was seen on/24/23 after a 2-day history of upper respiratory infection.  Patient was given fluticasone, montelukast, Promethazine DM and prednisone for her symptoms.  Patient states she has been taking all of her medication as prescribed.  She states her cough began to worsen 1 day ago.  She also states she has been using her nebulizer with no relief.  Her last nebulizer treatment was last evening.  She continues to complain of wheezing, chest tightness, and shortness of breath.  She states her other upper respiratory symptoms have improved.  Patient reports that she does have a history of asthma.  She states as a child, she had pneumonia several times, she also states that she had her left lung collapse.  She is concerned that her symptoms may have progressed to pneumonia.  Past Medical History:  Diagnosis Date   Anxiety    Asthma    Interstitial cystitis    Melanoma ([place])    Melanoma ([place])     There are no problems to display for this patient.   Past Surgical History:  Procedure Laterality Date   NORPLANT REMOVAL  04/28/2012   Procedure: REMOVAL OF NORPLANT;  Surgeon: Florian Buff, MD;  Location: [facility];  Service: Gynecology;  Laterality: N/A;  removal of implanon-started at [place]     as child   VAGINAL HYSTERECTOMY  04/28/2012   Procedure: HYSTERECTOMY VAGINAL;  Surgeon: Florian Buff, MD;  Location: [facility];  Service: Gynecology;  Laterality: N/A;    OB History     Gravida  1   Para  1   Term  1   Preterm      AB      Living  1      SAB      IAB      Ectopic      Multiple      Live Births                Home Medications    Prior to Admission medications   Medication Sig Start Date End Date Taking? Authorizing Provider  albuterol (VENTOLIN HFA) 108 (90 Base) MCG/ACT inhaler Inhale 2 puffs into the lungs every 6 (six) hours as needed for wheezing or shortness of breath. 04/16/22  Yes Annalisse Minkoff-Warren, Alda Lea, NP  amoxicillin-clavulanate (AUGMENTIN) 875-125 MG tablet Take 1 tablet by mouth every 12 (twelve) hours. 04/16/22  Yes Chriselda Leppert-Warren, Alda Lea, NP  HYDROcodone bit-homatropine (HYCODAN) 5-1.5 MG/5ML syrup Take 5 mLs by mouth every 6 (six) hours as needed for cough. 04/16/22  Yes Orenthal Debski-Warren, Alda Lea, NP  predniSONE (STERAPRED UNI-PAK 21 TAB) 10 MG (21) TBPK tablet Take by mouth daily. Take 6 tablets with breakfast on day 1. Take 5 tablets with breakfast on day 2. Take 4 tablets with breakfast on day 3. Take 3 tablets with breakfast on day 4. Take 2 tablets with breakfast on day 5. Take 1 tablet with breakfast on day 6. 04/16/22  Yes Vinita Prentiss-Warren, Alda Lea, NP  cetirizine (ZYRTEC) 10 MG chewable tablet Chew 10 mg by mouth  daily.    [provider]  dicyclomine (BENTYL) 10 MG capsule Take 1 capsule (10 mg total) by mouth 4 (four) times daily as needed for spasms. 08/20/21   Sharyn Creamer, MD  fluticasone (FLONASE) 50 MCG/ACT nasal spray Place 2 sprays into both nostrils daily. 04/12/22   Haylie Mccutcheon-Warren, Alda Lea, NP  hydrocortisone (ANUSOL-HC) 25 MG suppository Place 1 suppository (25 mg total) rectally 2 (two) times daily. For seven days 08/12/21   Sharyn Creamer, MD  montelukast (SINGULAIR) 10 MG tablet Take 1 tablet (10 mg total) by mouth at bedtime. 04/12/22   Tylique Aull-Warren, Alda Lea, NP  promethazine-dextromethorphan (PROMETHAZINE-DM) 6.25-15 MG/5ML syrup Take 5 mLs by mouth 4 (four) times daily as needed for cough. 04/12/22   Croix Presley-Warren, Alda Lea, NP  URELLE (URELLE/URISED) 81 MG TABS tablet Take 1 tablet (81 mg total) by mouth 4 (four) times daily. 01/18/15   Florian Buff, MD  valACYclovir (VALTREX) 500 MG tablet Take 500 tablets by mouth daily as needed. 06/12/21   [provider]    Family History Family History  Problem Relation Age of Onset   Colon polyps Mother    Cancer Mother        melanoma   Hypertension Mother    Heart disease Maternal Grandfather    Seizures Maternal Grandfather        mini   Cancer Paternal Grandfather        prostate    Asthma Daughter    Stomach cancer Other    Rectal cancer Other    Esophageal cancer Other    Colon cancer Other     Social History Social History   Tobacco Use   Smoking status: Former    Packs/day: 0.25    Years: 1.50    Total pack years: 0.38    Types: Cigarettes    Quit date: 04/22/2003    Years since quitting: 18.9   Smokeless tobacco: Never  Vaping Use   Vaping Use: Never used  Substance Use Topics   Alcohol use: Yes    Alcohol/week: 0.0 standard drinks of alcohol    Comment: occassional   Drug use: No    Comment: cocoaine- and ectasy 8 yrs ago     Allergies   Corn-containing products   Review of Systems Review of Systems Per HPI  Physical Exam Triage Vital Signs ED Triage Vitals [04/16/22 1017]  Enc Vitals Group     BP 117/79     Pulse Rate 70     Resp 18     Temp 98.2 F (36.8 C)     Temp Source Oral     SpO2 98 %     Weight      Height      Head Circumference      Peak Flow      Pain Score 0     Pain Loc      Pain Edu?      Excl. in [place]?    No data found.  Updated Vital Signs BP 117/79 (BP Location: Right Arm)   Pulse 70   Temp 98.2 F (36.8 C) (Oral)   Resp 18   LMP 04/18/2012   SpO2 98%   Visual Acuity Right Eye Distance:   Left Eye Distance:   Bilateral Distance:    Right Eye Near:   Left Eye Near:    Bilateral Near:     Physical Exam Vitals and nursing note reviewed.  Constitutional:  Appearance: Normal appearance.  HENT:     Head: Normocephalic.     Nose: Congestion present.  Eyes:     Extraocular Movements:  Extraocular movements intact.     Conjunctiva/sclera: Conjunctivae normal.     Pupils: Pupils are equal, round, and reactive to light.  Cardiovascular:     Rate and Rhythm: Normal rate and regular rhythm.     Pulses: Normal pulses.     Heart sounds: Normal heart sounds.  Pulmonary:     Effort: Pulmonary effort is normal.     Breath sounds: Wheezing (wheezing noted in all lung fields) present.  Abdominal:     General: Bowel sounds are normal.     Palpations: Abdomen is soft.  Skin:    General: Skin is warm and dry.     Capillary Refill: Capillary refill takes less than 2 seconds.  Neurological:     General: No focal deficit present.     Mental Status: She is alert and oriented to person, place, and time.  Psychiatric:        Mood and Affect: Mood normal.        Behavior: Behavior normal.      UC Treatments / Results  Labs (all labs ordered are listed, but only abnormal results are displayed) Labs Reviewed - No data to display  EKG   Radiology DG Chest 2 View  Result Date: 04/16/2022 CLINICAL DATA:  Cough EXAM: CHEST - 2 VIEW COMPARISON:  Radiograph 11/11/2017 FINDINGS: The heart size and mediastinal contours are within normal limits.There is mild perihilar bronchial wall thickening. No focal airspace consolidation.No pleural effusion or pneumothorax.No acute osseous abnormality. IMPRESSION: Findings suggestive of bronchitis.  No focal airspace consolidation. Electronically Signed   By: Maurine Simmering M.D.   On: 04/16/2022 10:43    Procedures Procedures (including critical care time)  Medications Ordered in UC Medications - No data to display  Initial Impression / Assessment and Plan / UC Course  I have reviewed the triage vital signs and the nursing notes.  Pertinent labs & imaging results that were available during my care of the patient were reviewed by me and considered in my medical decision making (see chart for details).  Patient presents for continued cough.  She  was seen on 04/12/2022 for the same or similar symptoms.  States cough worsened 1 day ago.  On exam, patient has wheezing throughout.  Did not do nebulizer treatment in the office as patient is currently on prednisone and is using nebulizer treatments at home.  The chest x-ray is negative for pneumonia, but does show acute bronchitis.  We will treat patient with Augmentin, Hycodan cough syrup, and another round of prednisone.  Patient was advised to continue the use of her nebulizer at home along with an albuterol inhaler.  Supportive care recommendations were provided to the patient along with indications of when to go to the emergency department. Final Clinical Impressions(s) / UC Diagnoses   Final diagnoses:  Acute bronchitis, unspecified organism  Moderate persistent asthma with acute exacerbation     Discharge Instructions      The chest x-ray shows you have acute bronchitis. Take medication as prescribed.  I am prescribing another round of steroids for your symptoms. Recommend Tylenol or ibuprofen as needed for pain, fever, or general discomfort. Recommend using a humidifier at bedtime during sleep to help with cough and nasal congestion. Sleep elevated on 2 pillows while symptoms persist. If you develop difficulty breathing, trouble breathing, or inability to  speak in a complete sentence, please go to the emergency department immediately.  If your symptoms do not improve with this medication regimen, please follow-up with your primary care physician.      ED Prescriptions     Medication Sig Dispense Auth. Provider   predniSONE (STERAPRED UNI-PAK 21 TAB) 10 MG (21) TBPK tablet Take by mouth daily. Take 6 tablets with breakfast on day 1. Take 5 tablets with breakfast on day 2. Take 4 tablets with breakfast on day 3. Take 3 tablets with breakfast on day 4. Take 2 tablets with breakfast on day 5. Take 1 tablet with breakfast on day 6. 21 tablet Maryagnes Carrasco-Warren, Alda Lea, NP    amoxicillin-clavulanate (AUGMENTIN) 875-125 MG tablet Take 1 tablet by mouth every 12 (twelve) hours. 14 tablet Ruvi Fullenwider-Warren, Alda Lea, NP   HYDROcodone bit-homatropine (HYCODAN) 5-1.5 MG/5ML syrup Take 5 mLs by mouth every 6 (six) hours as needed for cough. 120 mL Keiron Iodice-Warren, Alda Lea, NP   albuterol (VENTOLIN HFA) 108 (90 Base) MCG/ACT inhaler Inhale 2 puffs into the lungs every 6 (six) hours as needed for wheezing or shortness of breath. 8 g [place] Franson-Warren, Alda Lea, NP      PDMP not reviewed this encounter.   Tish Men, NP 04/16/22 1100    Tylesha Gibeault-Warren, Alda Lea, NP 04/16/22 1110

## 2022-04-16 NOTE — ED Triage Notes (Signed)
Pt reports was seen for same on Saturday and was diagnosed with sinusitis. Pt reports all other symptoms has resolved but reports even with using nebs every 4 hours cough remains and seems to be getting worse.

## 2022-04-16 NOTE — Discharge Instructions (Signed)
The chest x-ray shows you have acute bronchitis. Take medication as prescribed.  I am prescribing another round of steroids for your symptoms. Recommend Tylenol or ibuprofen as needed for pain, fever, or general discomfort. Recommend using a humidifier at bedtime during sleep to help with cough and nasal congestion. Sleep elevated on 2 pillows while symptoms persist. If you develop difficulty breathing, trouble breathing, or inability to speak in a complete sentence, please go to the emergency department immediately.  If your symptoms do not improve with this medication regimen, please follow-up with your primary care physician.

## 2022-04-18 ENCOUNTER — Telehealth: Payer: Self-pay | Admitting: Family Medicine

## 2022-04-18 MED ORDER — HYDROCODONE BIT-HOMATROP MBR 5-1.5 MG/5ML PO SOLN
5.0000 mL | Freq: Four times a day (QID) | ORAL | 0 refills | Status: DC | PRN
Start: 2022-04-18 — End: 2022-12-09

## 2022-04-18 NOTE — Telephone Encounter (Signed)
Prescription from visit 2 days ago for Hycodan accidentally printed rather than E scribed and prescription was not signed so pharmacy declined when patient tried to turn in.  Printed prescription destroyed, new prescription generated electronically and sent to pharmacy.  Patient notified.

## 2022-07-04 DIAGNOSIS — S134XXA Sprain of ligaments of cervical spine, initial encounter: Secondary | ICD-10-CM | POA: Diagnosis not present

## 2022-07-04 DIAGNOSIS — S338XXA Sprain of other parts of lumbar spine and pelvis, initial encounter: Secondary | ICD-10-CM | POA: Diagnosis not present

## 2022-07-04 DIAGNOSIS — S233XXA Sprain of ligaments of thoracic spine, initial encounter: Secondary | ICD-10-CM | POA: Diagnosis not present

## 2022-07-11 DIAGNOSIS — S233XXA Sprain of ligaments of thoracic spine, initial encounter: Secondary | ICD-10-CM | POA: Diagnosis not present

## 2022-07-11 DIAGNOSIS — S134XXA Sprain of ligaments of cervical spine, initial encounter: Secondary | ICD-10-CM | POA: Diagnosis not present

## 2022-07-11 DIAGNOSIS — S338XXA Sprain of other parts of lumbar spine and pelvis, initial encounter: Secondary | ICD-10-CM | POA: Diagnosis not present

## 2022-07-25 DIAGNOSIS — S338XXA Sprain of other parts of lumbar spine and pelvis, initial encounter: Secondary | ICD-10-CM | POA: Diagnosis not present

## 2022-07-25 DIAGNOSIS — S134XXA Sprain of ligaments of cervical spine, initial encounter: Secondary | ICD-10-CM | POA: Diagnosis not present

## 2022-07-25 DIAGNOSIS — S233XXA Sprain of ligaments of thoracic spine, initial encounter: Secondary | ICD-10-CM | POA: Diagnosis not present

## 2022-08-07 DIAGNOSIS — S338XXA Sprain of other parts of lumbar spine and pelvis, initial encounter: Secondary | ICD-10-CM | POA: Diagnosis not present

## 2022-08-07 DIAGNOSIS — S134XXA Sprain of ligaments of cervical spine, initial encounter: Secondary | ICD-10-CM | POA: Diagnosis not present

## 2022-08-07 DIAGNOSIS — S233XXA Sprain of ligaments of thoracic spine, initial encounter: Secondary | ICD-10-CM | POA: Diagnosis not present

## 2022-08-21 DIAGNOSIS — S134XXA Sprain of ligaments of cervical spine, initial encounter: Secondary | ICD-10-CM | POA: Diagnosis not present

## 2022-08-21 DIAGNOSIS — S338XXA Sprain of other parts of lumbar spine and pelvis, initial encounter: Secondary | ICD-10-CM | POA: Diagnosis not present

## 2022-08-21 DIAGNOSIS — S233XXA Sprain of ligaments of thoracic spine, initial encounter: Secondary | ICD-10-CM | POA: Diagnosis not present

## 2022-08-25 DIAGNOSIS — D225 Melanocytic nevi of trunk: Secondary | ICD-10-CM | POA: Diagnosis not present

## 2022-08-25 DIAGNOSIS — D2261 Melanocytic nevi of right upper limb, including shoulder: Secondary | ICD-10-CM | POA: Diagnosis not present

## 2022-08-25 DIAGNOSIS — D2361 Other benign neoplasm of skin of right upper limb, including shoulder: Secondary | ICD-10-CM | POA: Diagnosis not present

## 2022-08-25 DIAGNOSIS — D2272 Melanocytic nevi of left lower limb, including hip: Secondary | ICD-10-CM | POA: Diagnosis not present

## 2022-08-25 DIAGNOSIS — L570 Actinic keratosis: Secondary | ICD-10-CM | POA: Diagnosis not present

## 2022-08-25 DIAGNOSIS — D2271 Melanocytic nevi of right lower limb, including hip: Secondary | ICD-10-CM | POA: Diagnosis not present

## 2022-08-25 DIAGNOSIS — Z85820 Personal history of malignant melanoma of skin: Secondary | ICD-10-CM | POA: Diagnosis not present

## 2022-08-25 DIAGNOSIS — D485 Neoplasm of uncertain behavior of skin: Secondary | ICD-10-CM | POA: Diagnosis not present

## 2022-09-04 DIAGNOSIS — S134XXA Sprain of ligaments of cervical spine, initial encounter: Secondary | ICD-10-CM | POA: Diagnosis not present

## 2022-09-04 DIAGNOSIS — S233XXA Sprain of ligaments of thoracic spine, initial encounter: Secondary | ICD-10-CM | POA: Diagnosis not present

## 2022-09-04 DIAGNOSIS — S338XXA Sprain of other parts of lumbar spine and pelvis, initial encounter: Secondary | ICD-10-CM | POA: Diagnosis not present

## 2022-09-24 DIAGNOSIS — S233XXA Sprain of ligaments of thoracic spine, initial encounter: Secondary | ICD-10-CM | POA: Diagnosis not present

## 2022-09-24 DIAGNOSIS — S134XXA Sprain of ligaments of cervical spine, initial encounter: Secondary | ICD-10-CM | POA: Diagnosis not present

## 2022-09-24 DIAGNOSIS — S338XXA Sprain of other parts of lumbar spine and pelvis, initial encounter: Secondary | ICD-10-CM | POA: Diagnosis not present

## 2022-10-01 DIAGNOSIS — Z6826 Body mass index (BMI) 26.0-26.9, adult: Secondary | ICD-10-CM | POA: Diagnosis not present

## 2022-10-01 DIAGNOSIS — R69 Illness, unspecified: Secondary | ICD-10-CM | POA: Diagnosis not present

## 2022-10-01 DIAGNOSIS — E663 Overweight: Secondary | ICD-10-CM | POA: Diagnosis not present

## 2022-10-01 DIAGNOSIS — F329 Major depressive disorder, single episode, unspecified: Secondary | ICD-10-CM | POA: Diagnosis not present

## 2022-10-08 DIAGNOSIS — S134XXA Sprain of ligaments of cervical spine, initial encounter: Secondary | ICD-10-CM | POA: Diagnosis not present

## 2022-10-08 DIAGNOSIS — S338XXA Sprain of other parts of lumbar spine and pelvis, initial encounter: Secondary | ICD-10-CM | POA: Diagnosis not present

## 2022-10-08 DIAGNOSIS — S233XXA Sprain of ligaments of thoracic spine, initial encounter: Secondary | ICD-10-CM | POA: Diagnosis not present

## 2022-11-03 ENCOUNTER — Ambulatory Visit: Payer: 59 | Admitting: Obstetrics & Gynecology

## 2022-11-03 ENCOUNTER — Encounter: Payer: Self-pay | Admitting: Obstetrics & Gynecology

## 2022-11-03 VITALS — BP 107/77 | HR 70 | Ht 63.0 in | Wt 144.0 lb

## 2022-11-03 DIAGNOSIS — N951 Menopausal and female climacteric states: Secondary | ICD-10-CM | POA: Diagnosis not present

## 2022-11-03 MED ORDER — PAROXETINE HCL 20 MG PO TABS: 20.0000 mg | ORAL_TABLET | Freq: Every day | ORAL | 2 refills | Status: DC

## 2022-11-03 NOTE — Progress Notes (Signed)
Chief Complaint  Patient presents with   Hot Flashes    Insomnia, night sweats, anxiety, excessive thoughts, fatigue       37 y.o. G1P1001 Patient's last menstrual period was 04/18/2012. The current method of family planning is status post hysterectomy.  Outpatient Encounter Medications as of 11/03/2022  Medication Sig   buPROPion (WELLBUTRIN XL) 150 MG 24 hr tablet Take 150 mg by mouth daily.   cetirizine (ZYRTEC) 10 MG chewable tablet Chew 10 mg by mouth daily.   escitalopram (LEXAPRO) 10 MG tablet Take 10 mg by mouth daily.   PARoxetine (PAXIL) 20 MG tablet Take 1 tablet (20 mg total) by mouth daily.   albuterol (VENTOLIN HFA) 108 (90 Base) MCG/ACT inhaler Inhale 2 puffs into the lungs every 6 (six) hours as needed for wheezing or shortness of breath. (Patient not taking: Reported on 11/03/2022)   amoxicillin-clavulanate (AUGMENTIN) 875-125 MG tablet Take 1 tablet by mouth every 12 (twelve) hours. (Patient not taking: Reported on 11/03/2022)   dicyclomine (BENTYL) 10 MG capsule Take 1 capsule (10 mg total) by mouth 4 (four) times daily as needed for spasms. (Patient not taking: Reported on 11/03/2022)   fluticasone (FLONASE) 50 MCG/ACT nasal spray Place 2 sprays into both nostrils daily. (Patient not taking: Reported on 11/03/2022)   HYDROcodone bit-homatropine (HYCODAN) 5-1.5 MG/5ML syrup Take 5 mLs by mouth every 6 (six) hours as needed for cough. (Patient not taking: Reported on 11/03/2022)   hydrocortisone (ANUSOL-HC) 25 MG suppository Place 1 suppository (25 mg total) rectally 2 (two) times daily. For seven days (Patient not taking: Reported on 11/03/2022)   montelukast (SINGULAIR) 10 MG tablet Take 1 tablet (10 mg total) by mouth at bedtime. (Patient not taking: Reported on 11/03/2022)   predniSONE (STERAPRED UNI-PAK 21 TAB) 10 MG (21) TBPK tablet Take by mouth daily. Take 6 tablets with breakfast on day 1. Take 5 tablets with breakfast on day 2. Take 4 tablets with breakfast on day  3. Take 3 tablets with breakfast on day 4. Take 2 tablets with breakfast on day 5. Take 1 tablet with breakfast on day 6. (Patient not taking: Reported on 11/03/2022)   promethazine-dextromethorphan (PROMETHAZINE-DM) 6.25-15 MG/5ML syrup Take 5 mLs by mouth 4 (four) times daily as needed for cough. (Patient not taking: Reported on 11/03/2022)   URELLE (URELLE/URISED) 81 MG TABS tablet Take 1 tablet (81 mg total) by mouth 4 (four) times daily. (Patient not taking: Reported on 11/03/2022)   valACYclovir (VALTREX) 500 MG tablet Take 500 tablets by mouth daily as needed. (Patient not taking: Reported on 11/03/2022)   No facility-administered encounter medications on file as of 11/03/2022.    Subjective Pt with 2 months of increased anxiety and depressive symptoms Some insomnia as well On lexapro for 1 month and seems no better Mostly repetitive ruminating thoughts Most worried about her daughter's relationship  Maybe processing some issues from her past is my feeling  Past Medical History:  Diagnosis Date   Anxiety    Asthma    Interstitial cystitis    Melanoma ([place])    Melanoma ([place])     Past Surgical History:  Procedure Laterality Date   NORPLANT REMOVAL  04/28/2012   Procedure: REMOVAL OF NORPLANT;  Surgeon: Florian Buff, MD;  Location: [facility];  Service: Gynecology;  Laterality: N/A;  removal of implanon-started at [place]     as child   VAGINAL HYSTERECTOMY  04/28/2012   Procedure: HYSTERECTOMY VAGINAL;  Surgeon: Florian Buff, MD;  Location: [facility];  Service: Gynecology;  Laterality: N/A;    OB History     Gravida  1   Para  1   Term  1   Preterm      AB      Living  1      SAB      IAB      Ectopic      Multiple      Live Births              Allergies  Allergen Reactions   Corn-Containing Products Hives and Itching    REACTION: Sneezing    Social History   Socioeconomic History   Marital status: Married    Spouse name: Not on file    Number of children: Not on file   Years of education: Not on file   Highest education level: Not on file  Occupational History   Not on file  Tobacco Use   Smoking status: Former    Packs/day: 0.25    Years: 1.50    Total pack years: 0.38    Types: Cigarettes    Quit date: 04/22/2003    Years since quitting: 19.5   Smokeless tobacco: Never  Vaping Use   Vaping Use: Never used  Substance and Sexual Activity   Alcohol use: Yes    Alcohol/week: 0.0 standard drinks of alcohol    Comment: occassional   Drug use: No    Comment: cocoaine- and ectasy 8 yrs ago   Sexual activity: Yes    Birth control/protection: Surgical    Comment: hyst  Other Topics Concern   Not on file  Social History Narrative   Not on file   Social Determinants of Health   Financial Resource Strain: Not on file  Food Insecurity: Not on file  Transportation Needs: Not on file  Physical Activity: Not on file  Stress: Not on file  Social Connections: Not on file    Family History  Problem Relation Age of Onset   Colon polyps Mother    Cancer Mother        melanoma   Hypertension Mother    Heart disease Maternal Grandfather    Seizures Maternal Grandfather        mini   Cancer Paternal Grandfather        prostate    Asthma Daughter    Stomach cancer Other    Rectal cancer Other    Esophageal cancer Other    Colon cancer Other     Medications:       Current Outpatient Medications:    buPROPion (WELLBUTRIN XL) 150 MG 24 hr tablet, Take 150 mg by mouth daily., Disp: , Rfl:    cetirizine (ZYRTEC) 10 MG chewable tablet, Chew 10 mg by mouth daily., Disp: , Rfl:    escitalopram (LEXAPRO) 10 MG tablet, Take 10 mg by mouth daily., Disp: , Rfl:    PARoxetine (PAXIL) 20 MG tablet, Take 1 tablet (20 mg total) by mouth daily., Disp: 30 tablet, Rfl: 2   albuterol (VENTOLIN HFA) 108 (90 Base) MCG/ACT inhaler, Inhale 2 puffs into the lungs every 6 (six) hours as needed for wheezing or shortness of breath.  (Patient not taking: Reported on 11/03/2022), Disp: 8 g, Rfl: 0   amoxicillin-clavulanate (AUGMENTIN) 875-125 MG tablet, Take 1 tablet by mouth every 12 (twelve) hours. (Patient not taking: Reported on 11/03/2022), Disp: 14 tablet, Rfl: 0   dicyclomine (BENTYL)  10 MG capsule, Take 1 capsule (10 mg total) by mouth 4 (four) times daily as needed for spasms. (Patient not taking: Reported on 11/03/2022), Disp: 30 capsule, Rfl: 0   fluticasone (FLONASE) 50 MCG/ACT nasal spray, Place 2 sprays into both nostrils daily. (Patient not taking: Reported on 11/03/2022), Disp: 16 g, Rfl: 0   HYDROcodone bit-homatropine (HYCODAN) 5-1.5 MG/5ML syrup, Take 5 mLs by mouth every 6 (six) hours as needed for cough. (Patient not taking: Reported on 11/03/2022), Disp: 120 mL, Rfl: 0   hydrocortisone (ANUSOL-HC) 25 MG suppository, Place 1 suppository (25 mg total) rectally 2 (two) times daily. For seven days (Patient not taking: Reported on 11/03/2022), Disp: 14 suppository, Rfl: 0   montelukast (SINGULAIR) 10 MG tablet, Take 1 tablet (10 mg total) by mouth at bedtime. (Patient not taking: Reported on 11/03/2022), Disp: 30 tablet, Rfl: 0   predniSONE (STERAPRED UNI-PAK 21 TAB) 10 MG (21) TBPK tablet, Take by mouth daily. Take 6 tablets with breakfast on day 1. Take 5 tablets with breakfast on day 2. Take 4 tablets with breakfast on day 3. Take 3 tablets with breakfast on day 4. Take 2 tablets with breakfast on day 5. Take 1 tablet with breakfast on day 6. (Patient not taking: Reported on 11/03/2022), Disp: 21 tablet, Rfl: 0   promethazine-dextromethorphan (PROMETHAZINE-DM) 6.25-15 MG/5ML syrup, Take 5 mLs by mouth 4 (four) times daily as needed for cough. (Patient not taking: Reported on 11/03/2022), Disp: 140 mL, Rfl: 0   URELLE (URELLE/URISED) 81 MG TABS tablet, Take 1 tablet (81 mg total) by mouth 4 (four) times daily. (Patient not taking: Reported on 11/03/2022), Disp: 120 each, Rfl: 4   valACYclovir (VALTREX) 500 MG tablet, Take 500  tablets by mouth daily as needed. (Patient not taking: Reported on 11/03/2022), Disp: , Rfl:   Objective Blood pressure 107/77, pulse 70, height '5\' 3"'$  (1.6 m), weight 144 lb (65.3 kg), last menstrual period 04/18/2012.  Gen WDWN NAD  Pertinent ROS No burning with urination, frequency or urgency No nausea, vomiting or diarrhea Nor fever chills or other constitutional symptoms   Labs or studies     Impression + Management Plan: Diagnoses this Encounter::   ICD-10-CM   1. Menopausal symptoms  N95.1 FSH    Thyroid Panel With TSH        Medications prescribed during  this encounter: Meds ordered this encounter  Medications   PARoxetine (PAXIL) 20 MG tablet    Sig: Take 1 tablet (20 mg total) by mouth daily.    Dispense:  30 tablet    Refill:  2    Labs or Scans Ordered during this encounter: Orders Placed This Encounter  Procedures   [place]   Thyroid Panel With TSH   Stop lexapro and wellbutirn, try Paxil more for OCD issues Check TSH FSH  Follow up 1 month   Follow up No follow-ups on file.

## 2022-11-04 ENCOUNTER — Other Ambulatory Visit (HOSPITAL_COMMUNITY)
Admission: RE | Admit: 2022-11-04 | Discharge: 2022-11-04 | Disposition: A | Discharge status: Home / Self Care | Payer: 59 | Source: Ambulatory Visit | Attending: Obstetrics & Gynecology | Admitting: Obstetrics & Gynecology

## 2022-11-04 DIAGNOSIS — N951 Menopausal and female climacteric states: Secondary | ICD-10-CM | POA: Insufficient documentation

## 2022-11-04 LAB — TSH: TSH: 1.402 u[IU]/mL (ref 0.350–4.500)

## 2022-11-05 ENCOUNTER — Encounter: Payer: Self-pay | Admitting: Obstetrics & Gynecology

## 2022-11-05 LAB — T4: T4, Total: 8.8 ug/dL (ref 4.5–12.0)

## 2022-11-05 LAB — FOLLICLE STIMULATING HORMONE: FSH: 13.8 m[IU]/mL

## 2022-11-05 LAB — T3 UPTAKE: T3 Uptake Ratio: 28 % (ref 24–39)

## 2022-11-07 DIAGNOSIS — S134XXA Sprain of ligaments of cervical spine, initial encounter: Secondary | ICD-10-CM | POA: Diagnosis not present

## 2022-11-07 DIAGNOSIS — S233XXA Sprain of ligaments of thoracic spine, initial encounter: Secondary | ICD-10-CM | POA: Diagnosis not present

## 2022-11-07 DIAGNOSIS — S338XXA Sprain of other parts of lumbar spine and pelvis, initial encounter: Secondary | ICD-10-CM | POA: Diagnosis not present

## 2022-11-21 DIAGNOSIS — S338XXA Sprain of other parts of lumbar spine and pelvis, initial encounter: Secondary | ICD-10-CM | POA: Diagnosis not present

## 2022-11-21 DIAGNOSIS — S233XXA Sprain of ligaments of thoracic spine, initial encounter: Secondary | ICD-10-CM | POA: Diagnosis not present

## 2022-11-21 DIAGNOSIS — S134XXA Sprain of ligaments of cervical spine, initial encounter: Secondary | ICD-10-CM | POA: Diagnosis not present

## 2022-12-05 DIAGNOSIS — S134XXA Sprain of ligaments of cervical spine, initial encounter: Secondary | ICD-10-CM | POA: Diagnosis not present

## 2022-12-05 DIAGNOSIS — S338XXA Sprain of other parts of lumbar spine and pelvis, initial encounter: Secondary | ICD-10-CM | POA: Diagnosis not present

## 2022-12-05 DIAGNOSIS — S233XXA Sprain of ligaments of thoracic spine, initial encounter: Secondary | ICD-10-CM | POA: Diagnosis not present

## 2022-12-09 ENCOUNTER — Ambulatory Visit: Payer: 59 | Admitting: Obstetrics & Gynecology

## 2022-12-09 ENCOUNTER — Encounter: Payer: Self-pay | Admitting: Obstetrics & Gynecology

## 2022-12-09 VITALS — BP 97/72 | HR 77 | Wt 139.0 lb

## 2022-12-09 DIAGNOSIS — F5101 Primary insomnia: Secondary | ICD-10-CM | POA: Diagnosis not present

## 2022-12-09 DIAGNOSIS — F418 Other specified anxiety disorders: Secondary | ICD-10-CM

## 2022-12-09 MED ORDER — PAROXETINE HCL 40 MG PO TABS: 40.0000 mg | ORAL_TABLET | Freq: Every day | ORAL | 3 refills | Status: DC

## 2022-12-09 NOTE — Progress Notes (Signed)
Chief Complaint  Patient presents with   Follow-up      37 y.o. G1P1001 Patient's last menstrual period was 04/18/2012. The current method of family planning is status post hysterectomy.  Outpatient Encounter Medications as of 12/09/2022  Medication Sig   cetirizine (ZYRTEC) 10 MG chewable tablet Chew 10 mg by mouth daily.   [DISCONTINUED] PARoxetine (PAXIL) 20 MG tablet Take 1 tablet (20 mg total) by mouth daily.   albuterol (VENTOLIN HFA) 108 (90 Base) MCG/ACT inhaler Inhale 2 puffs into the lungs every 6 (six) hours as needed for wheezing or shortness of breath. (Patient not taking: Reported on 11/03/2022)   fluticasone (FLONASE) 50 MCG/ACT nasal spray Place 2 sprays into both nostrils daily. (Patient not taking: Reported on 11/03/2022)   hydrocortisone (ANUSOL-HC) 25 MG suppository Place 1 suppository (25 mg total) rectally 2 (two) times daily. For seven days (Patient not taking: Reported on 11/03/2022)   montelukast (SINGULAIR) 10 MG tablet Take 1 tablet (10 mg total) by mouth at bedtime. (Patient not taking: Reported on 11/03/2022)   PARoxetine (PAXIL) 40 MG tablet Take 1 tablet (40 mg total) by mouth daily.   URELLE (URELLE/URISED) 81 MG TABS tablet Take 1 tablet (81 mg total) by mouth 4 (four) times daily. (Patient not taking: Reported on 11/03/2022)   valACYclovir (VALTREX) 500 MG tablet Take 500 tablets by mouth daily as needed. (Patient not taking: Reported on 11/03/2022)   [DISCONTINUED] amoxicillin-clavulanate (AUGMENTIN) 875-125 MG tablet Take 1 tablet by mouth every 12 (twelve) hours. (Patient not taking: Reported on 11/03/2022)   [DISCONTINUED] buPROPion (WELLBUTRIN XL) 150 MG 24 hr tablet Take 150 mg by mouth daily. (Patient not taking: Reported on 12/09/2022)   [DISCONTINUED] dicyclomine (BENTYL) 10 MG capsule Take 1 capsule (10 mg total) by mouth 4 (four) times daily as needed for spasms. (Patient not taking: Reported on 11/03/2022)   [DISCONTINUED] escitalopram (LEXAPRO) 10  MG tablet Take 10 mg by mouth daily.   [DISCONTINUED] HYDROcodone bit-homatropine (HYCODAN) 5-1.5 MG/5ML syrup Take 5 mLs by mouth every 6 (six) hours as needed for cough. (Patient not taking: Reported on 11/03/2022)   [DISCONTINUED] predniSONE (STERAPRED UNI-PAK 21 TAB) 10 MG (21) TBPK tablet Take by mouth daily. Take 6 tablets with breakfast on day 1. Take 5 tablets with breakfast on day 2. Take 4 tablets with breakfast on day 3. Take 3 tablets with breakfast on day 4. Take 2 tablets with breakfast on day 5. Take 1 tablet with breakfast on day 6. (Patient not taking: Reported on 11/03/2022)   [DISCONTINUED] promethazine-dextromethorphan (PROMETHAZINE-DM) 6.25-15 MG/5ML syrup Take 5 mLs by mouth 4 (four) times daily as needed for cough. (Patient not taking: Reported on 11/03/2022)   No facility-administered encounter medications on file as of 12/09/2022.    Subjective Pt states she is "significantly" improved Not as reactive Is able to be self aware in the moment Sexual response is unchanged, orgasm dysfunction noticeable using "scream cream" again  Past Medical History:  Diagnosis Date   Anxiety    Asthma    Interstitial cystitis    Melanoma ([place])    Melanoma ([place])     Past Surgical History:  Procedure Laterality Date   NORPLANT REMOVAL  04/28/2012   Procedure: REMOVAL OF NORPLANT;  Surgeon: Florian Buff, MD;  Location: [facility];  Service: Gynecology;  Laterality: N/A;  removal of implanon-started at [place]     as child   VAGINAL HYSTERECTOMY  04/28/2012   Procedure:  HYSTERECTOMY VAGINAL;  Surgeon: Florian Buff, MD;  Location: [facility];  Service: Gynecology;  Laterality: N/A;    OB History     Gravida  1   Para  1   Term  1   Preterm      AB      Living  1      SAB      IAB      Ectopic      Multiple      Live Births              Allergies  Allergen Reactions   Corn-Containing Products Hives and Itching    REACTION: Sneezing    Social  History   Socioeconomic History   Marital status: Married    Spouse name: Not on file   Number of children: Not on file   Years of education: Not on file   Highest education level: Not on file  Occupational History   Not on file  Tobacco Use   Smoking status: Former    Packs/day: 0.25    Years: 1.50    Total pack years: 0.38    Types: Cigarettes    Quit date: 04/22/2003    Years since quitting: 19.6   Smokeless tobacco: Never  Vaping Use   Vaping Use: Never used  Substance and Sexual Activity   Alcohol use: Yes    Alcohol/week: 0.0 standard drinks of alcohol    Comment: occassional   Drug use: No    Comment: cocoaine- and ectasy 8 yrs ago   Sexual activity: Yes    Birth control/protection: Surgical    Comment: hyst  Other Topics Concern   Not on file  Social History Narrative   Not on file   Social Determinants of Health   Financial Resource Strain: Not on file  Food Insecurity: Not on file  Transportation Needs: Not on file  Physical Activity: Not on file  Stress: Not on file  Social Connections: Not on file    Family History  Problem Relation Age of Onset   Colon polyps Mother    Cancer Mother        melanoma   Hypertension Mother    Heart disease Maternal Grandfather    Seizures Maternal Grandfather        mini   Cancer Paternal Grandfather        prostate    Asthma Daughter    Stomach cancer Other    Rectal cancer Other    Esophageal cancer Other    Colon cancer Other     Medications:       Current Outpatient Medications:    cetirizine (ZYRTEC) 10 MG chewable tablet, Chew 10 mg by mouth daily., Disp: , Rfl:    albuterol (VENTOLIN HFA) 108 (90 Base) MCG/ACT inhaler, Inhale 2 puffs into the lungs every 6 (six) hours as needed for wheezing or shortness of breath. (Patient not taking: Reported on 11/03/2022), Disp: 8 g, Rfl: 0   fluticasone (FLONASE) 50 MCG/ACT nasal spray, Place 2 sprays into both nostrils daily. (Patient not taking: Reported on  11/03/2022), Disp: 16 g, Rfl: 0   hydrocortisone (ANUSOL-HC) 25 MG suppository, Place 1 suppository (25 mg total) rectally 2 (two) times daily. For seven days (Patient not taking: Reported on 11/03/2022), Disp: 14 suppository, Rfl: 0   montelukast (SINGULAIR) 10 MG tablet, Take 1 tablet (10 mg total) by mouth at bedtime. (Patient not taking: Reported on 11/03/2022), Disp: 30 tablet, Rfl: 0  PARoxetine (PAXIL) 40 MG tablet, Take 1 tablet (40 mg total) by mouth daily., Disp: 30 tablet, Rfl: 3   URELLE (URELLE/URISED) 81 MG TABS tablet, Take 1 tablet (81 mg total) by mouth 4 (four) times daily. (Patient not taking: Reported on 11/03/2022), Disp: 120 each, Rfl: 4   valACYclovir (VALTREX) 500 MG tablet, Take 500 tablets by mouth daily as needed. (Patient not taking: Reported on 11/03/2022), Disp: , Rfl:   Objective Blood pressure 97/72, pulse 77, weight 139 lb (63 kg), last menstrual period 04/18/2012.  Gen WDWN NAD  Pertinent ROS No burning with urination, frequency or urgency No nausea, vomiting or diarrhea Nor fever chills or other constitutional symptoms   Labs or studies Reviewed labs drawn at previous visit    Impression + Management Plan: Diagnoses this Encounter::   ICD-10-CM   1. Anxiety associated with depression  F41.8    on paxil 20 which has improved her state, will increase to 40 mg    2. Primary insomnia, still an issue, improved  F51.01    will consider trazadone in the future, for now try melatonin, aslo recommend exercise/activity        Medications prescribed during  this encounter: Meds ordered this encounter  Medications   PARoxetine (PAXIL) 40 MG tablet    Sig: Take 1 tablet (40 mg total) by mouth daily.    Dispense:  30 tablet    Refill:  3    Labs or Scans Ordered during this encounter: No orders of the defined types were placed in this encounter.     Follow up Return in about 3 months (around 03/09/2023) for Follow up, with Dr Elonda Husky.

## 2023-01-12 ENCOUNTER — Other Ambulatory Visit: Payer: Self-pay | Admitting: Adult Health

## 2023-01-12 ENCOUNTER — Encounter: Payer: Self-pay | Admitting: Obstetrics & Gynecology

## 2023-01-12 DIAGNOSIS — K6289 Other specified diseases of anus and rectum: Secondary | ICD-10-CM

## 2023-01-12 MED ORDER — ONDANSETRON HCL 4 MG PO TABS: 4.0000 mg | ORAL_TABLET | Freq: Three times a day (TID) | ORAL | 0 refills | Status: DC | PRN

## 2023-01-12 MED ORDER — HYDROCORTISONE ACETATE 25 MG RE SUPP: 25.0000 mg | Freq: Two times a day (BID) | RECTAL | 1 refills | Status: AC

## 2023-01-12 NOTE — Progress Notes (Signed)
Rx zofran and anusol supp.

## 2023-01-26 ENCOUNTER — Ambulatory Visit
Admission: EM | Admit: 2023-01-26 | Discharge: 2023-01-26 | Disposition: A | Discharge status: Home / Self Care | Payer: 59 | Attending: Nurse Practitioner | Admitting: Nurse Practitioner

## 2023-01-26 DIAGNOSIS — J069 Acute upper respiratory infection, unspecified: Secondary | ICD-10-CM | POA: Insufficient documentation

## 2023-01-26 DIAGNOSIS — Z1152 Encounter for screening for COVID-19: Secondary | ICD-10-CM | POA: Insufficient documentation

## 2023-01-26 DIAGNOSIS — Z20818 Contact with and (suspected) exposure to other bacterial communicable diseases: Secondary | ICD-10-CM | POA: Insufficient documentation

## 2023-01-26 LAB — POCT RAPID STREP A (OFFICE): Rapid Strep A Screen: NEGATIVE

## 2023-01-26 MED ORDER — BENZONATATE 100 MG PO CAPS: 100.0000 mg | ORAL_CAPSULE | Freq: Three times a day (TID) | ORAL | 0 refills | Status: DC | PRN

## 2023-01-26 NOTE — ED Provider Notes (Signed)
[facility]    CSN: [id number] Arrival date & time: 01/26/23  1504      History   Chief Complaint Chief Complaint  Patient presents with   Cough    HPI Sheri Wyatt is a 37 y.o. female.   Patient presents today for 3-day history of dry cough, shortness of breath and wheezing, chest tightness, chest and nasal congestion, runny nose, sore throat, right-sided ear pain without drainage, nausea without vomiting, and fatigue.  No fever, bodyaches or chills, chest congestion, chest pain, abdominal pain, vomiting, diarrhea.  Has not using nebulizer every 4-6 hours as needed for wheezing or shortness of breath which does help with the chest tightness.  Has also been taking Coricidin, Zyrtec, and Benadryl with minimal symptom improvement.  Reports that she recently returned from a cruise yesterday and was on the cruise for 9 days.  Reports she took an at-home COVID test 01/24/2023 which was negative.  Reports she was exposed to somebody with strep throat while on the cruise.  Reports other people who attended the cruise with her have come down with similar symptoms in the past few days.    Past Medical History:  Diagnosis Date   Anxiety    Asthma    Interstitial cystitis    Melanoma    Melanoma     There are no problems to display for this patient.   Past Surgical History:  Procedure Laterality Date   NORPLANT REMOVAL  04/28/2012   Procedure: REMOVAL OF NORPLANT;  Surgeon: Lazaro ArmsLuther H Eure, MD;  Location: [facility];  Service: Gynecology;  Laterality: N/A;  removal of implanon-started at 1604   TONGUE SURGERY     as child   VAGINAL HYSTERECTOMY  04/28/2012   Procedure: HYSTERECTOMY VAGINAL;  Surgeon: Lazaro ArmsLuther H Eure, MD;  Location: [facility];  Service: Gynecology;  Laterality: N/A;    OB History     Gravida  1   Para  1   Term  1   Preterm      AB      Living  1      SAB      IAB      Ectopic      Multiple      Live Births               Home  Medications    Prior to Admission medications   Medication Sig Start Date End Date Taking? Authorizing Provider  benzonatate (TESSALON) 100 MG capsule Take 1 capsule (100 mg total) by mouth 3 (three) times daily as needed for cough. Do not take with alcohol or while driving or operating heavy machinery.  May cause drowsiness. 01/26/23  Yes Valentino NoseMartinez, Ritu Gagliardo A, NP  ondansetron (ZOFRAN) 4 MG tablet Take 1 tablet (4 mg total) by mouth every 8 (eight) hours as needed for nausea or vomiting. 01/12/23   Adline PotterGriffin, Jennifer A, NP  albuterol (VENTOLIN HFA) 108 (90 Base) MCG/ACT inhaler Inhale 2 puffs into the lungs every 6 (six) hours as needed for wheezing or shortness of breath. Patient not taking: Reported on 11/03/2022 04/16/22   Leath-Warren, Sadie Haberhristie J, NP  cetirizine (ZYRTEC) 10 MG chewable tablet Chew 10 mg by mouth daily.    [provider]  fluticasone (FLONASE) 50 MCG/ACT nasal spray Place 2 sprays into both nostrils daily. Patient not taking: Reported on 11/03/2022 04/12/22   Leath-Warren, Sadie Haberhristie J, NP  hydrocortisone (ANUSOL-HC) 25 MG suppository Place 1 suppository (25 mg total) rectally 2 (  two) times daily. For seven days 01/12/23   Cyril Mourning A, NP  montelukast (SINGULAIR) 10 MG tablet Take 1 tablet (10 mg total) by mouth at bedtime. Patient not taking: Reported on 11/03/2022 04/12/22   Leath-Warren, Sadie Haber, NP  PARoxetine (PAXIL) 40 MG tablet Take 1 tablet (40 mg total) by mouth daily. 12/09/22   [name], MD  URELLE (URELLE/URISED) 81 MG TABS tablet Take 1 tablet (81 mg total) by mouth 4 (four) times daily. Patient not taking: Reported on 11/03/2022 01/18/15   [name], MD  valACYclovir (VALTREX) 500 MG tablet Take 500 tablets by mouth daily as needed. Patient not taking: Reported on 11/03/2022 06/12/21   [provider]    Family History Family History  Problem Relation Age of Onset   Colon polyps Mother    Cancer Mother        melanoma   Hypertension  Mother    Heart disease Maternal Grandfather    Seizures Maternal Grandfather        mini   Cancer Paternal Grandfather        prostate    Asthma Daughter    Stomach cancer Other    Rectal cancer Other    Esophageal cancer Other    Colon cancer Other     Social History Social History   Tobacco Use   Smoking status: Former    Packs/day: 0.25    Years: 1.50    Additional pack years: 0.00    Total pack years: 0.38    Types: Cigarettes    Quit date: 04/22/2003    Years since quitting: 19.7   Smokeless tobacco: Never  Vaping Use   Vaping Use: Never used  Substance Use Topics   Alcohol use: Yes    Alcohol/week: 0.0 standard drinks of alcohol    Comment: occassional   Drug use: No    Comment: cocoaine- and ectasy 8 yrs ago     Allergies   Corn-containing products   Review of Systems Review of Systems Per HPI  Physical Exam Triage Vital Signs ED Triage Vitals  Enc Vitals Group     BP 01/26/23 1528 104/70     Pulse Rate 01/26/23 1528 93     Resp 01/26/23 1528 16     Temp 01/26/23 1528 98.1 F (36.7 C)     Temp Source 01/26/23 1528 Oral     SpO2 01/26/23 1528 98 %     Weight --      Height --      Head Circumference --      Peak Flow --      Pain Score 01/26/23 1531 7     Pain Loc --      Pain Edu? --      Excl. in GC? --    No data found.  Updated Vital Signs BP 104/70 (BP Location: Right Arm)   Pulse 93   Temp 98.1 F (36.7 C) (Oral)   Resp 16   LMP 04/18/2012   SpO2 98%   Visual Acuity Right Eye Distance:   Left Eye Distance:   Bilateral Distance:    Right Eye Near:   Left Eye Near:    Bilateral Near:     Physical Exam Vitals and nursing note reviewed.  Constitutional:      General: She is not in acute distress.    Appearance: Normal appearance. She is not ill-appearing or toxic-appearing.  HENT:     Head: Normocephalic and atraumatic.  Right Ear: Tympanic membrane, ear canal and external ear normal.     Left Ear: Tympanic  membrane, ear canal and external ear normal.     Nose: Rhinorrhea present. No congestion.     Mouth/Throat:     Mouth: Mucous membranes are moist.     Pharynx: Oropharynx is clear. Posterior oropharyngeal erythema present. No oropharyngeal exudate.  Eyes:     General: No scleral icterus.    Extraocular Movements: Extraocular movements intact.  Cardiovascular:     Rate and Rhythm: Normal rate and regular rhythm.  Pulmonary:     Effort: Pulmonary effort is normal. No respiratory distress.     Breath sounds: Normal breath sounds. No wheezing, rhonchi or rales.  Abdominal:     General: Abdomen is flat. Bowel sounds are normal. There is no distension.     Palpations: Abdomen is soft.  Musculoskeletal:     Cervical back: Normal range of motion and neck supple.  Lymphadenopathy:     Cervical: No cervical adenopathy.  Skin:    General: Skin is warm and dry.     Coloration: Skin is not jaundiced or pale.     Findings: No erythema or rash.  Neurological:     Mental Status: She is alert and oriented to person, place, and time.  Psychiatric:        Behavior: Behavior is cooperative.      UC Treatments / Results  Labs (all labs ordered are listed, but only abnormal results are displayed) Labs Reviewed  SARS CORONAVIRUS 2 (TAT 6-24 HRS)  POCT RAPID STREP A (OFFICE)    EKG   Radiology No results found.  Procedures Procedures (including critical care time)  Medications Ordered in UC Medications - No data to display  Initial Impression / Assessment and Plan / UC Course  I have reviewed the triage vital signs and the nursing notes.  Pertinent labs & imaging results that were available during my care of the patient were reviewed by me and considered in my medical decision making (see chart for details).   Patient is well-appearing, normotensive, afebrile, not tachycardic, not tachypneic, oxygenating well on room air.    1. Exposure to strep throat Rapid strep throat test is  negative Centor score today 0 Throat culture deferred  2. Viral URI with cough 3.Encounter for screening for COVID-19 Suspect viral etiology Vital signs and examination today are reassuring Supportive care discussed Start cough suppressant-Tessalon sent to pharmacy ER and return precautions discussed with patient  The patient was given the opportunity to ask questions.  All questions answered to their satisfaction.  The patient is in agreement to this plan.    Final Clinical Impressions(s) / UC Diagnoses   Final diagnoses:  Exposure to strep throat  Viral URI with cough  Encounter for screening for COVID-19     Discharge Instructions      You have a viral upper respiratory infection.  Symptoms should improve over the next week to 10 days.  If you develop chest pain or shortness of breath, go to the emergency room.  We have tested you today for COVID-19.  You will see the results in Mychart and we will call you with positive results.  Please stay home and isolate until you are aware of the results.   Some things that can make you feel better are: - Increased rest - Increasing fluid with water/sugar free electrolytes - Acetaminophen and ibuprofen as needed for fever/pain - Salt water gargling, chloraseptic spray and  throat lozenges - OTC guaifenesin (Mucinex) 600 mg twice daily - Saline sinus flushes or a neti pot - Humidifying the air -Tessalon Perles during the day as needed for dry cough     ED Prescriptions     Medication Sig Dispense Auth. Provider   benzonatate (TESSALON) 100 MG capsule Take 1 capsule (100 mg total) by mouth 3 (three) times daily as needed for cough. Do not take with alcohol or while driving or operating heavy machinery.  May cause drowsiness. 21 capsule Valentino Nose, NP      PDMP not reviewed this encounter.   Valentino Nose, NP 01/26/23 1626

## 2023-01-26 NOTE — ED Triage Notes (Addendum)
Pt c/o cough x 2 days, pt has tried home nebulizer states it helped with the cough a little so she could get some rest. Sore throat, ear pain, headache, congestion, x 3 days. Has progressively gotten worse.   Was exposed to strep. Negative covid

## 2023-01-26 NOTE — Discharge Instructions (Signed)
You have a viral upper respiratory infection.  Symptoms should improve over the next week to 10 days.  If you develop chest pain or shortness of breath, go to the emergency room.  We have tested you today for COVID-19.  You will see the results in Mychart and we will call you with positive results.  Please stay home and isolate until you are aware of the results.    Some things that can make you feel better are: - Increased rest - Increasing fluid with water/sugar free electrolytes - Acetaminophen and ibuprofen as needed for fever/pain - Salt water gargling, chloraseptic spray and throat lozenges - OTC guaifenesin (Mucinex) 600 mg twice daily - Saline sinus flushes or a neti pot - Humidifying the air -Tessalon Perles during the day as needed for dry cough 

## 2023-01-27 LAB — SARS CORONAVIRUS 2 (TAT 6-24 HRS): SARS Coronavirus 2: NEGATIVE

## 2023-01-28 DIAGNOSIS — J45909 Unspecified asthma, uncomplicated: Secondary | ICD-10-CM | POA: Diagnosis not present

## 2023-01-28 DIAGNOSIS — J069 Acute upper respiratory infection, unspecified: Secondary | ICD-10-CM | POA: Diagnosis not present

## 2023-03-09 ENCOUNTER — Encounter: Payer: Self-pay | Admitting: Obstetrics & Gynecology

## 2023-03-09 ENCOUNTER — Ambulatory Visit: Payer: 59 | Admitting: Obstetrics & Gynecology

## 2023-03-09 VITALS — BP 100/72 | HR 73 | Ht 63.0 in | Wt 154.0 lb

## 2023-03-09 DIAGNOSIS — R61 Generalized hyperhidrosis: Secondary | ICD-10-CM

## 2023-03-09 DIAGNOSIS — F418 Other specified anxiety disorders: Secondary | ICD-10-CM

## 2023-03-09 DIAGNOSIS — F5101 Primary insomnia: Secondary | ICD-10-CM

## 2023-03-09 MED ORDER — ESTRADIOL 2 MG PO TABS: 2.0000 mg | ORAL_TABLET | Freq: Every day | ORAL | 11 refills | Status: DC

## 2023-03-09 MED ORDER — TRAZODONE HCL 50 MG PO TABS: 50.0000 mg | ORAL_TABLET | Freq: Every day | ORAL | 1 refills | Status: AC

## 2023-03-09 NOTE — Progress Notes (Signed)
Follow up appointment for response: Anxiety  Chief Complaint  Patient presents with   Follow-up    Blood pressure 100/72, pulse 73, height 5\' 3"  (1.6 m), weight 154 lb (69.9 kg), last menstrual period 04/18/2012.  3 issues really to follow up on: below  MEDS ordered this encounter: Meds ordered this encounter  Medications   traZODone (DESYREL) 50 MG tablet    Sig: Take 1 tablet (50 mg total) by mouth at bedtime.    Dispense:  30 tablet    Refill:  1   estradiol (ESTRACE) 2 MG tablet    Sig: Take 1 tablet (2 mg total) by mouth daily.    Dispense:  30 tablet    Refill:  11    Orders for this encounter: No orders of the defined types were placed in this encounter.   Impression + Management Plan   ICD-10-CM   1. Anxiety associated with depression  F41.8    doing great with Paxil 40 mg, her thought life is much improved, anxiety greatly reduced    2. Primary insomnia, still an issue, trial trazdone 50 mg prn  F51.01     3. Night sweats  R61    trial estradiol 2 mg qhs to see if that helps at all, not menopausal but will see if it helps, if not, then patient will stop therapy      Follow Up: Return if symptoms worsen or fail to improve.     All questions were answered.  Past Medical History:  Diagnosis Date   Anxiety    Asthma    Interstitial cystitis    Melanoma ([facility])    Melanoma ([facility])     Past Surgical History:  Procedure Laterality Date   NORPLANT REMOVAL  04/28/2012   Procedure: REMOVAL OF NORPLANT;  Surgeon: [name], MD;  Location: [facility];  Service: Gynecology;  Laterality: N/A;  removal of implanon-started at 1604   TONGUE SURGERY     as child   VAGINAL HYSTERECTOMY  04/28/2012   Procedure: HYSTERECTOMY VAGINAL;  Surgeon: [name], MD;  Location: [facility];  Service: Gynecology;  Laterality: N/A;    OB History     Gravida  1   Para  1   Term  1   Preterm      AB      Living  1      SAB      IAB      Ectopic      Multiple       Live Births              Allergies  Allergen Reactions   Corn-Containing Products Hives and Itching    REACTION: Sneezing    Social History   Socioeconomic History   Marital status: Married    Spouse name: Not on file   Number of children: Not on file   Years of education: Not on file   Highest education level: Not on file  Occupational History   Not on file  Tobacco Use   Smoking status: Former    Packs/day: 0.25    Years: 1.50    Additional pack years: 0.00    Total pack years: 0.38    Types: Cigarettes    Quit date: 04/22/2003    Years since quitting: 19.8   Smokeless tobacco: Never  Vaping Use   Vaping Use: Never used  Substance and Sexual Activity   Alcohol use: Yes    Alcohol/week:  0.0 standard drinks of alcohol    Comment: occassional   Drug use: No    Comment: cocoaine- and ectasy 8 yrs ago   Sexual activity: Yes    Birth control/protection: Surgical    Comment: hyst  Other Topics Concern   Not on file  Social History Narrative   Not on file   Social Determinants of Health   Financial Resource Strain: Not on file  Food Insecurity: Not on file  Transportation Needs: Not on file  Physical Activity: Not on file  Stress: Not on file  Social Connections: Not on file    Family History  Problem Relation Age of Onset   Colon polyps Mother    Cancer Mother        melanoma   Hypertension Mother    Heart disease Maternal Grandfather    Seizures Maternal Grandfather        mini   Cancer Paternal Grandfather        prostate    Asthma Daughter    Stomach cancer Other    Rectal cancer Other    Esophageal cancer Other    Colon cancer Other

## 2023-03-11 DIAGNOSIS — H00019 Hordeolum externum unspecified eye, unspecified eyelid: Secondary | ICD-10-CM | POA: Diagnosis not present

## 2023-03-30 ENCOUNTER — Other Ambulatory Visit: Payer: Self-pay | Admitting: Adult Health

## 2023-03-30 ENCOUNTER — Encounter: Payer: Self-pay | Admitting: Obstetrics & Gynecology

## 2023-03-30 MED ORDER — URELLE 81 MG PO TABS
1.0000 | ORAL_TABLET | Freq: Four times a day (QID) | ORAL | 0 refills | Status: DC
Start: 2023-03-30 — End: 2023-10-19

## 2023-03-30 NOTE — Progress Notes (Signed)
Refill sent on [facility]

## 2023-04-18 ENCOUNTER — Ambulatory Visit
Admission: RE | Admit: 2023-04-18 | Discharge: 2023-04-18 | Disposition: A | Discharge status: Home / Self Care | Payer: 59 | Source: Ambulatory Visit | Attending: Family Medicine | Admitting: Family Medicine

## 2023-04-18 ENCOUNTER — Other Ambulatory Visit: Payer: Self-pay

## 2023-04-18 VITALS — BP 107/74 | HR 84 | Temp 97.8°F | Resp 20

## 2023-04-18 DIAGNOSIS — T07XXXA Unspecified multiple injuries, initial encounter: Secondary | ICD-10-CM | POA: Diagnosis not present

## 2023-04-18 DIAGNOSIS — L0100 Impetigo, unspecified: Secondary | ICD-10-CM

## 2023-04-18 MED ORDER — MUPIROCIN 2 % EX OINT: 1.0000 | TOPICAL_OINTMENT | Freq: Two times a day (BID) | CUTANEOUS | 0 refills | Status: DC

## 2023-04-18 MED ORDER — AMOXICILLIN-POT CLAVULANATE 875-125 MG PO TABS: 1.0000 | ORAL_TABLET | Freq: Two times a day (BID) | ORAL | 0 refills | Status: DC

## 2023-04-18 NOTE — ED Provider Notes (Signed)
[facility]    CSN: [id number] Arrival date & time: 04/18/23  0959      History   Chief Complaint Chief Complaint  Patient presents with   Rash    Entered by patient    HPI Sheri Wyatt is a 37 y.o. female.   Patient presenting today with several week history of raw painful spots to the inside of her nostrils and down below the nostrils.  There is some crusting to them but no drainage.  Denies fevers, chills, body aches, injury to the area.  Also has some bite marks from her puppy on both forearms that have not been improving over the past month and are becoming more red in color.  Denies drainage from these areas.  Applying Vaseline to these areas with no benefit.    Past Medical History:  Diagnosis Date   Anxiety    Asthma    Interstitial cystitis    Melanoma (HCC)    Melanoma (HCC)     There are no problems to display for this patient.   Past Surgical History:  Procedure Laterality Date   NORPLANT REMOVAL  04/28/2012   Procedure: REMOVAL OF NORPLANT;  Surgeon: [name], MD;  Location: [facility];  Service: Gynecology;  Laterality: N/A;  removal of implanon-started at 1604   TONGUE SURGERY     as child   VAGINAL HYSTERECTOMY  04/28/2012   Procedure: HYSTERECTOMY VAGINAL;  Surgeon: [name], MD;  Location: [facility];  Service: Gynecology;  Laterality: N/A;    OB History     Gravida  1   Para  1   Term  1   Preterm      AB      Living  1      SAB      IAB      Ectopic      Multiple      Live Births               Home Medications    Prior to Admission medications   Medication Sig Start Date End Date Taking? Authorizing Provider  amoxicillin-clavulanate (AUGMENTIN) 875-125 MG tablet Take 1 tablet by mouth every 12 (twelve) hours. 04/18/23  Yes Particia Nearing, PA-C  mupirocin ointment (BACTROBAN) 2 % Apply 1 Application topically 2 (two) times daily. 04/18/23  Yes Particia Nearing, PA-C  ondansetron  (ZOFRAN) 4 MG tablet Take 1 tablet (4 mg total) by mouth every 8 (eight) hours as needed for nausea or vomiting. Patient not taking: Reported on 03/09/2023 01/12/23   Adline Potter, NP  albuterol (VENTOLIN HFA) 108 (90 Base) MCG/ACT inhaler Inhale 2 puffs into the lungs every 6 (six) hours as needed for wheezing or shortness of breath. 04/16/22   Leath-Warren, Sadie Haber, NP  benzonatate (TESSALON) 100 MG capsule Take 1 capsule (100 mg total) by mouth 3 (three) times daily as needed for cough. Do not take with alcohol or while driving or operating heavy machinery.  May cause drowsiness. Patient not taking: Reported on 03/09/2023 01/26/23   Valentino Nose, NP  cetirizine (ZYRTEC) 10 MG chewable tablet Chew 10 mg by mouth daily.    [provider]  estradiol (ESTRACE) 2 MG tablet Take 1 tablet (2 mg total) by mouth daily. 03/09/23   [name], MD  hydrocortisone (ANUSOL-HC) 25 MG suppository Place 1 suppository (25 mg total) rectally 2 (two) times daily. For seven days Patient not taking: Reported on 03/09/2023  01/12/23   Adline Potter, NP  PARoxetine (PAXIL) 40 MG tablet Take 1 tablet (40 mg total) by mouth daily. 12/09/22   [name], MD  traZODone (DESYREL) 50 MG tablet Take 1 tablet (50 mg total) by mouth at bedtime. Patient not taking: Reported on 04/18/2023 03/09/23   [name], MD  Urelle (URELLE/URISED) 81 MG TABS tablet Take 1 tablet (81 mg total) by mouth 4 (four) times daily. 03/30/23   Adline Potter, NP  valACYclovir (VALTREX) 500 MG tablet Take 500 tablets by mouth daily as needed. Patient not taking: Reported on 11/03/2022 06/12/21   [provider]    Family History Family History  Problem Relation Age of Onset   Colon polyps Mother    Cancer Mother        melanoma   Hypertension Mother    Heart disease Maternal Grandfather    Seizures Maternal Grandfather        mini   Cancer Paternal Grandfather        prostate    Asthma Daughter     Stomach cancer Other    Rectal cancer Other    Esophageal cancer Other    Colon cancer Other     Social History Social History   Tobacco Use   Smoking status: Former    Packs/day: 0.25    Years: 1.50    Additional pack years: 0.00    Total pack years: 0.38    Types: Cigarettes    Quit date: 04/22/2003    Years since quitting: 20.0   Smokeless tobacco: Never  Vaping Use   Vaping Use: Never used  Substance Use Topics   Alcohol use: Yes    Alcohol/week: 0.0 standard drinks of alcohol    Comment: occassional   Drug use: No    Comment: cocoaine- and ectasy 8 yrs ago     Allergies   Corn-containing products   Review of Systems Review of Systems PER HPI  Physical Exam Triage Vital Signs ED Triage Vitals  Enc Vitals Group     BP 04/18/23 1025 107/74     Pulse Rate 04/18/23 1025 84     Resp 04/18/23 1025 20     Temp 04/18/23 1025 97.8 F (36.6 C)     Temp Source 04/18/23 1025 Oral     SpO2 04/18/23 1025 97 %     Weight --      Height --      Head Circumference --      Peak Flow --      Pain Score 04/18/23 1023 0     Pain Loc --      Pain Edu? --      Excl. in GC? --    No data found.  Updated Vital Signs BP 107/74 (BP Location: Right Arm)   Pulse 84   Temp 97.8 F (36.6 C) (Oral)   Resp 20   LMP 04/18/2012   SpO2 97%   Visual Acuity Right Eye Distance:   Left Eye Distance:   Bilateral Distance:    Right Eye Near:   Left Eye Near:    Bilateral Near:     Physical Exam Vitals and nursing note reviewed.  Constitutional:      Appearance: Normal appearance. She is not ill-appearing.  HENT:     Head: Atraumatic.  Eyes:     Extraocular Movements: Extraocular movements intact.     Conjunctiva/sclera: Conjunctivae normal.  Cardiovascular:     Rate and  Rhythm: Normal rate and regular rhythm.     Heart sounds: Normal heart sounds.  Pulmonary:     Effort: Pulmonary effort is normal.     Breath sounds: Normal breath sounds.  Musculoskeletal:         General: Normal range of motion.     Cervical back: Normal range of motion and neck supple.  Skin:    General: Skin is warm.     Comments: Golden crusted lesions inside bilateral nostrils worse on the left.  Erythematous abrasions to bilateral forearms from puppy bite marks.  No active drainage, fluctuance to the areas  Neurological:     Mental Status: She is alert and oriented to person, place, and time.  Psychiatric:        Mood and Affect: Mood normal.        Thought Content: Thought content normal.        Judgment: Judgment normal.      UC Treatments / Results  Labs (all labs ordered are listed, but only abnormal results are displayed) Labs Reviewed - No data to display  EKG   Radiology No results found.  Procedures Procedures (including critical care time)  Medications Ordered in UC Medications - No data to display  Initial Impression / Assessment and Plan / UC Course  I have reviewed the triage vital signs and the nursing notes.  Pertinent labs & imaging results that were available during my care of the patient were reviewed by me and considered in my medical decision making (see chart for details).     Treat with Augmentin, mupirocin for impetigo and dog bite marks.  Discussed good home wound care and return precautions.  Final Clinical Impressions(s) / UC Diagnoses   Final diagnoses:  Impetigo  Abrasions of multiple sites   Discharge Instructions   None    ED Prescriptions     Medication Sig Dispense Auth. Provider   amoxicillin-clavulanate (AUGMENTIN) 875-125 MG tablet Take 1 tablet by mouth every 12 (twelve) hours. 14 tablet Particia Nearing, [place]   mupirocin ointment (BACTROBAN) 2 % Apply 1 Application topically 2 (two) times daily. 22 g Particia Nearing, [place]      PDMP not reviewed this encounter.   Particia Nearing, [place] 04/18/23 1056

## 2023-04-18 NOTE — ED Triage Notes (Addendum)
Pt reports intermittent redness and "raw places" to nose for last several weeks. Pt reports history of similar on face.  Pt also states has "puppy bite marks" to right forearm that are not healing and have "stayed red."

## 2023-04-20 ENCOUNTER — Other Ambulatory Visit: Payer: Self-pay | Admitting: Obstetrics & Gynecology

## 2023-09-02 DIAGNOSIS — S338XXA Sprain of other parts of lumbar spine and pelvis, initial encounter: Secondary | ICD-10-CM | POA: Diagnosis not present

## 2023-09-02 DIAGNOSIS — S134XXA Sprain of ligaments of cervical spine, initial encounter: Secondary | ICD-10-CM | POA: Diagnosis not present

## 2023-09-02 DIAGNOSIS — S233XXA Sprain of ligaments of thoracic spine, initial encounter: Secondary | ICD-10-CM | POA: Diagnosis not present

## 2023-09-25 DIAGNOSIS — S134XXA Sprain of ligaments of cervical spine, initial encounter: Secondary | ICD-10-CM | POA: Diagnosis not present

## 2023-09-25 DIAGNOSIS — S338XXA Sprain of other parts of lumbar spine and pelvis, initial encounter: Secondary | ICD-10-CM | POA: Diagnosis not present

## 2023-09-25 DIAGNOSIS — S233XXA Sprain of ligaments of thoracic spine, initial encounter: Secondary | ICD-10-CM | POA: Diagnosis not present

## 2023-10-19 ENCOUNTER — Encounter: Payer: Self-pay | Admitting: Obstetrics & Gynecology

## 2023-10-19 ENCOUNTER — Other Ambulatory Visit: Payer: Self-pay | Admitting: *Deleted

## 2023-10-19 MED ORDER — URELLE 81 MG PO TABS
1.0000 | ORAL_TABLET | Freq: Four times a day (QID) | ORAL | 0 refills | Status: DC
Start: 2023-10-19 — End: 2024-05-06

## 2023-10-20 MED ORDER — PHENAZOPYRIDINE HCL 200 MG PO TABS: 200.0000 mg | ORAL_TABLET | Freq: Three times a day (TID) | ORAL | 1 refills | Status: DC | PRN

## 2023-10-20 NOTE — Addendum Note (Signed)
Addended by: [name] on: 10/20/2023 01:58 PM   Modules accepted: Orders

## 2023-10-26 ENCOUNTER — Encounter: Payer: Self-pay | Admitting: Obstetrics & Gynecology

## 2023-10-30 ENCOUNTER — Ambulatory Visit (INDEPENDENT_AMBULATORY_CARE_PROVIDER_SITE_OTHER): Payer: 59 | Admitting: Obstetrics & Gynecology

## 2023-10-30 ENCOUNTER — Encounter: Payer: Self-pay | Admitting: Obstetrics & Gynecology

## 2023-10-30 VITALS — BP 121/82 | HR 103 | Ht 63.0 in | Wt 150.5 lb

## 2023-10-30 DIAGNOSIS — N3010 Interstitial cystitis (chronic) without hematuria: Secondary | ICD-10-CM | POA: Diagnosis not present

## 2023-10-30 MED ORDER — ELMIRON 100 MG PO CAPS: 200.0000 mg | ORAL_CAPSULE | Freq: Two times a day (BID) | ORAL | 3 refills | Status: AC

## 2023-10-30 NOTE — Progress Notes (Signed)
 Diagnosed with IC: many years ago, quiet for several years  Current Meds:  Restart elmiron , DMSO  Dietary restrictions    Pt states her symptoms have been stable, no exacerbations, although not perfect Wants to continue on this cycle  Blood pressure 121/82, pulse (!) 103, height 5' 3 (1.6 m), weight 150 lb 8 oz (68.3 kg), last menstrual period 04/18/2012.    The external urethra meatus was prepped with betadine DMSO 50 cc was instilled in the usual fashion after the bladder was catheterized and emptied completely 50cc was instilled into the bladder without difficulty and the patient tolerated well She will refrain from voiding as long as possible  Follow up in 2 weeks, or as patient requests based on her symptom complex

## 2023-11-03 LAB — URINE CULTURE

## 2023-11-04 ENCOUNTER — Other Ambulatory Visit: Payer: Self-pay | Admitting: Obstetrics & Gynecology

## 2023-11-04 MED ORDER — SULFAMETHOXAZOLE-TRIMETHOPRIM 800-160 MG PO TABS: 1.0000 | ORAL_TABLET | Freq: Two times a day (BID) | ORAL | 0 refills | Status: DC

## 2023-11-09 ENCOUNTER — Ambulatory Visit: Payer: 59 | Admitting: Obstetrics & Gynecology

## 2023-11-13 ENCOUNTER — Ambulatory Visit: Payer: 59 | Admitting: Obstetrics & Gynecology

## 2023-11-13 VITALS — BP 117/77 | HR 80

## 2023-11-13 DIAGNOSIS — N3010 Interstitial cystitis (chronic) without hematuria: Secondary | ICD-10-CM

## 2023-11-13 NOTE — Progress Notes (Signed)
Diagnosed with IC: years ago, has been quiet>recent flare, recent +culture: Proteus  Current Meds:  DMSO irrigation Dietary restrictions    Pt states her symptoms have been stable, no exacerbations, although not perfect Wants to continue on this cycle  Blood pressure 117/77, pulse 80, last menstrual period 04/18/2012.    The external urethra meatus was prepped with betadine DMSO 50 cc was instilled in the usual fashion after the bladder was catheterized and emptied completely 50cc was instilled into the bladder without difficulty and the patient tolerated well She will refrain from voiding as long as possible  Follow up in 2 weeks, or as patient requests based on her symptom complex

## 2023-12-01 ENCOUNTER — Ambulatory Visit: Payer: 59 | Admitting: Obstetrics & Gynecology

## 2024-01-27 ENCOUNTER — Other Ambulatory Visit: Payer: Self-pay | Admitting: Adult Health

## 2024-01-27 MED ORDER — ONDANSETRON HCL 4 MG PO TABS
4.0000 mg | ORAL_TABLET | Freq: Three times a day (TID) | ORAL | 0 refills | Status: AC | PRN
Start: 2024-01-27 — End: ?

## 2024-01-27 NOTE — Telephone Encounter (Signed)
 Refilled zofran

## 2024-04-25 ENCOUNTER — Ambulatory Visit
Admission: RE | Admit: 2024-04-25 | Discharge: 2024-04-25 | Disposition: A | Discharge status: Home / Self Care | Source: Ambulatory Visit | Attending: Family Medicine | Admitting: Family Medicine

## 2024-04-25 VITALS — BP 104/71 | HR 72 | Temp 97.9°F | Resp 16

## 2024-04-25 DIAGNOSIS — J069 Acute upper respiratory infection, unspecified: Secondary | ICD-10-CM | POA: Diagnosis not present

## 2024-04-25 DIAGNOSIS — J4521 Mild intermittent asthma with (acute) exacerbation: Secondary | ICD-10-CM | POA: Diagnosis not present

## 2024-04-25 MED ORDER — PREDNISONE 10 MG PO TABS
ORAL_TABLET | ORAL | 0 refills | Status: DC
Start: 2024-04-25 — End: 2024-06-21

## 2024-04-25 MED ORDER — PROMETHAZINE-DM 6.25-15 MG/5ML PO SYRP: 5.0000 mL | ORAL_SOLUTION | Freq: Four times a day (QID) | ORAL | 0 refills | Status: AC | PRN

## 2024-04-25 MED ORDER — ALBUTEROL SULFATE (2.5 MG/3ML) 0.083% IN NEBU: 2.5000 mg | INHALATION_SOLUTION | Freq: Four times a day (QID) | RESPIRATORY_TRACT | 0 refills | Status: AC | PRN

## 2024-04-25 NOTE — ED Provider Notes (Signed)
 [facility]    CSN: [id number] Arrival date & time: 04/25/24  1259      History   Chief Complaint Chief Complaint  Patient presents with   Cough    Entered by patient    HPI Sheri Wyatt is a 38 y.o. female.   Patient presenting today with 5-day history of congestion, cough, body aches, chills, chest tightness, wheezing, shortness of breath.  Denies fever, abdominal pain, nausea vomiting or diarrhea.  So far using her albuterol  every 4-6 hours for asthma exacerbation and taking over-the-counter cold and congestion medication.  She also had some leftover prednisone  and has been taking this.  She states typically when her symptoms get like this she has to have a prednisone  taper.    Past Medical History:  Diagnosis Date   Anxiety    Asthma    Interstitial cystitis    Melanoma (HCC)    Melanoma (HCC)     There are no active problems to display for this patient.   Past Surgical History:  Procedure Laterality Date   NORPLANT  REMOVAL  04/28/2012   Procedure: REMOVAL OF NORPLANT ;  Surgeon: Vonn VEAR Inch, MD;  Location: [facility];  Service: Gynecology;  Laterality: N/A;  removal of implanon-started at 1604   TONGUE SURGERY     as child   VAGINAL HYSTERECTOMY  04/28/2012   Procedure: HYSTERECTOMY VAGINAL;  Surgeon: Vonn VEAR Inch, MD;  Location: [facility];  Service: Gynecology;  Laterality: N/A;    OB History     Gravida  1   Para  1   Term  1   Preterm      AB      Living  1      SAB      IAB      Ectopic      Multiple      Live Births               Home Medications    Prior to Admission medications   Medication Sig Start Date End Date Taking? Authorizing Provider  albuterol  (PROVENTIL ) (2.5 MG/3ML) 0.083% nebulizer solution Take 3 mLs (2.5 mg total) by nebulization every 6 (six) hours as needed for wheezing or shortness of breath. 04/25/24  Yes Stuart Vernell Norris, PA-C  cetirizine (ZYRTEC) 10 MG chewable tablet Chew 10 mg by mouth  daily.   Yes [provider]  PARoxetine  (PAXIL ) 40 MG tablet TAKE (1) TABLET BY MOUTH ONCE DAILY. 04/28/23  Yes Inch Vonn VEAR, MD  predniSONE  (DELTASONE ) 10 MG tablet Take 6 tabs day one, 5 tabs day two, 4 tabs day three, etc 04/25/24  Yes Stuart Vernell Norris, PA-C  promethazine -dextromethorphan (PROMETHAZINE -DM) 6.25-15 MG/5ML syrup Take 5 mLs by mouth 4 (four) times daily as needed. 04/25/24  Yes Stuart Vernell Norris, PA-C  albuterol  (VENTOLIN  HFA) 108 (90 Base) MCG/ACT inhaler Inhale 2 puffs into the lungs every 6 (six) hours as needed for wheezing or shortness of breath. Patient not taking: Reported on 11/13/2023 04/16/22   Leath-Warren, Etta PARAS, NP  amoxicillin -clavulanate (AUGMENTIN ) 875-125 MG tablet Take 1 tablet by mouth every 12 (twelve) hours. Patient not taking: Reported on 11/13/2023 04/18/23   Stuart Vernell Norris, PA-C  benzonatate  (TESSALON ) 100 MG capsule Take 1 capsule (100 mg total) by mouth 3 (three) times daily as needed for cough. Do not take with alcohol or while driving or operating heavy machinery.  May cause drowsiness. Patient not taking: Reported on 03/09/2023 01/26/23   Chandra,  Jessica A, NP  estradiol  (ESTRACE ) 2 MG tablet Take 1 tablet (2 mg total) by mouth daily. Patient not taking: Reported on 11/13/2023 03/09/23   Jayne Vonn DEL, MD  hydrocortisone  (ANUSOL -HC) 25 MG suppository Place 1 suppository (25 mg total) rectally 2 (two) times daily. For seven days Patient not taking: Reported on 03/09/2023 01/12/23   Signa Nest A, NP  mupirocin  ointment (BACTROBAN ) 2 % Apply 1 Application topically 2 (two) times daily. Patient not taking: Reported on 11/13/2023 04/18/23   Stuart Vernell Norris, PA-C  ondansetron  (ZOFRAN ) 4 MG tablet Take 1 tablet (4 mg total) by mouth every 8 (eight) hours as needed for nausea or vomiting. 01/27/24   Signa Nest LABOR, NP  pentosan polysulfate (ELMIRON ) 100 MG capsule Take 2 capsules (200 mg total) by mouth 2 (two) times daily.  10/30/23   Jayne Vonn DEL, MD  phenazopyridine  (PYRIDIUM ) 200 MG tablet Take 1 tablet (200 mg total) by mouth 3 (three) times daily as needed for pain. Patient not taking: Reported on 11/13/2023 10/20/23   Jayne Vonn DEL, MD  sulfamethoxazole -trimethoprim  (BACTRIM  DS) 800-160 MG tablet Take 1 tablet by mouth 2 (two) times daily. 11/04/23   Jayne Vonn DEL, MD  TIRZEPATIDE [place] Inject into the skin. Patient not taking: Reported on 11/13/2023    [provider]  traZODone  (DESYREL ) 50 MG tablet Take 1 tablet (50 mg total) by mouth at bedtime. Patient not taking: Reported on 10/30/2023 03/09/23   Jayne Vonn DEL, MD  Urelle  (URELLE ALMER) 81 MG TABS tablet Take 1 tablet (81 mg total) by mouth 4 (four) times daily. Patient not taking: Reported on 10/30/2023 10/19/23   Signa Nest LABOR, NP  valACYclovir (VALTREX) 500 MG tablet Take 500 tablets by mouth daily as needed. Patient not taking: Reported on 10/30/2023 06/12/21   [provider]    Family History Family History  Problem Relation Age of Onset   Colon polyps Mother    Cancer Mother        melanoma   Hypertension Mother    Heart disease Maternal Grandfather    Seizures Maternal Grandfather        mini   Cancer Paternal Grandfather        prostate    Asthma Daughter    Stomach cancer Other    Rectal cancer Other    Esophageal cancer Other    Colon cancer Other     Social History Social History   Tobacco Use   Smoking status: Former    Current packs/day: 0.00    Average packs/day: 0.3 packs/day for 1.5 years (0.4 ttl pk-yrs)    Types: Cigarettes    Start date: 10/21/2001    Quit date: 04/22/2003    Years since quitting: 21.0   Smokeless tobacco: Never  Vaping Use   Vaping status: Never Used  Substance Use Topics   Alcohol use: Yes    Alcohol/week: 0.0 standard drinks of alcohol    Comment: occassional   Drug use: No    Comment: cocoaine- and ectasy 8 yrs ago     Allergies   Corn-containing  products   Review of Systems Review of Systems Per HPI  Physical Exam Triage Vital Signs ED Triage Vitals [04/25/24 1312]  Encounter Vitals Group     BP 104/71     Girls Systolic BP Percentile      Girls Diastolic BP Percentile      Boys Systolic BP Percentile      Boys Diastolic BP Percentile  Pulse Rate 72     Resp 16     Temp 97.9 F (36.6 C)     Temp Source Oral     SpO2 98 %     Weight      Height      Head Circumference      Peak Flow      Pain Score 0     Pain Loc      Pain Education      Exclude from Growth Chart    No data found.  Updated Vital Signs BP 104/71 (BP Location: Right Arm)   Pulse 72   Temp 97.9 F (36.6 C) (Oral)   Resp 16   LMP 04/18/2012   SpO2 98%   Visual Acuity Right Eye Distance:   Left Eye Distance:   Bilateral Distance:    Right Eye Near:   Left Eye Near:    Bilateral Near:     Physical Exam Vitals and nursing note reviewed.  Constitutional:      Appearance: Normal appearance.  HENT:     Head: Atraumatic.     Right Ear: Tympanic membrane and external ear normal.     Left Ear: Tympanic membrane and external ear normal.     Nose: Rhinorrhea present.     Mouth/Throat:     Mouth: Mucous membranes are moist.     Pharynx: Posterior oropharyngeal erythema present.  Eyes:     Extraocular Movements: Extraocular movements intact.     Conjunctiva/sclera: Conjunctivae normal.  Cardiovascular:     Rate and Rhythm: Normal rate and regular rhythm.     Heart sounds: Normal heart sounds.  Pulmonary:     Effort: Pulmonary effort is normal.     Breath sounds: Normal breath sounds. No wheezing.  Musculoskeletal:        General: Normal range of motion.     Cervical back: Normal range of motion and neck supple.  Skin:    General: Skin is warm and dry.  Neurological:     Mental Status: She is alert and oriented to person, place, and time.  Psychiatric:        Mood and Affect: Mood normal.        Thought Content: Thought  content normal.      UC Treatments / Results  Labs (all labs ordered are listed, but only abnormal results are displayed) Labs Reviewed - No data to display  EKG   Radiology No results found.  Procedures Procedures (including critical care time)  Medications Ordered in UC Medications - No data to display  Initial Impression / Assessment and Plan / UC Course  I have reviewed the triage vital signs and the nursing notes.  Pertinent labs & imaging results that were available during my care of the patient were reviewed by me and considered in my medical decision making (see chart for details).     Suspect viral respiratory infection leading to an asthma exacerbation.  Treat with prednisone  taper, refill albuterol  neb solution and Phenergan  DM.  Discussed supportive home care, return precautions.  Final Clinical Impressions(s) / UC Diagnoses   Final diagnoses:  Viral URI with cough  Mild intermittent asthma with acute exacerbation   Discharge Instructions   None    ED Prescriptions     Medication Sig Dispense Auth. Provider   predniSONE  (DELTASONE ) 10 MG tablet Take 6 tabs day one, 5 tabs day two, 4 tabs day three, etc 21 tablet Stuart Vernell Norris, PA-C  promethazine -dextromethorphan (PROMETHAZINE -DM) 6.25-15 MG/5ML syrup Take 5 mLs by mouth 4 (four) times daily as needed. 100 mL Stuart Vernell Norris, PA-C   albuterol  (PROVENTIL ) (2.5 MG/3ML) 0.083% nebulizer solution Take 3 mLs (2.5 mg total) by nebulization every 6 (six) hours as needed for wheezing or shortness of breath. 75 mL Stuart Vernell Norris, [place]      PDMP not reviewed this encounter.   Stuart Vernell Norris, [place] 04/25/24 1418

## 2024-04-25 NOTE — ED Triage Notes (Signed)
 Cough, congestion, body aches, chills x 5 days. Taking Nyquil and prednisone . Pt states she feels better but still having cough and chest tightness and possible asthma flare up.

## 2024-05-05 ENCOUNTER — Encounter: Payer: Self-pay | Admitting: Obstetrics & Gynecology

## 2024-05-06 ENCOUNTER — Other Ambulatory Visit: Payer: Self-pay | Admitting: Adult Health

## 2024-05-06 ENCOUNTER — Ambulatory Visit: Admitting: *Deleted

## 2024-05-06 DIAGNOSIS — R350 Frequency of micturition: Secondary | ICD-10-CM | POA: Diagnosis not present

## 2024-05-06 DIAGNOSIS — R309 Painful micturition, unspecified: Secondary | ICD-10-CM | POA: Diagnosis not present

## 2024-05-06 LAB — POCT URINALYSIS DIPSTICK
Blood, UA: NEGATIVE
Glucose, UA: NEGATIVE
Ketones, UA: NEGATIVE
Leukocytes, UA: NEGATIVE
Nitrite, UA: POSITIVE
Protein, UA: NEGATIVE

## 2024-05-06 MED ORDER — PHENAZOPYRIDINE HCL 200 MG PO TABS: 200.0000 mg | ORAL_TABLET | Freq: Three times a day (TID) | ORAL | 1 refills | Status: AC | PRN

## 2024-05-06 MED ORDER — AMOXICILLIN 500 MG PO CAPS: 500.0000 mg | ORAL_CAPSULE | Freq: Three times a day (TID) | ORAL | 0 refills | Status: DC

## 2024-05-06 NOTE — Progress Notes (Signed)
   NURSE VISIT- UTI SYMPTOMS   SUBJECTIVE:  Sheri Wyatt is a 38 y.o. [id number] female here for UTI symptoms. She is a GYN patient. She reports urinary frequency & pain with urination X 7 days.  OBJECTIVE:  LMP 04/18/2012   Appears well, in no apparent distress  Results for orders placed or performed in visit on 05/06/24 (from the past 24 hours)  POCT Urinalysis Dipstick   Collection Time: 05/06/24 12:16 PM  Result Value Ref Range   Color, UA     Clarity, UA     Glucose, UA Negative Negative   Bilirubin, UA     Ketones, UA neg    Spec Grav, UA     Blood, UA neg    pH, UA     Protein, UA Negative Negative   Urobilinogen, UA     Nitrite, UA positive    Leukocytes, UA Negative Negative   Appearance     Odor      ASSESSMENT: GYN patient with UTI symptoms and positive nitrites  PLAN: Discussed with Delon Lewis, AGNP   Rx sent by provider today: Yes. Please send in Pyridium  also.  Urine culture sent Call or return to clinic prn if these symptoms worsen or fail to improve as anticipated. Follow-up: as needed   Clarita Salt  05/06/2024 12:20 PM

## 2024-05-06 NOTE — Progress Notes (Signed)
 Rx amoxicillin   and pyridium 

## 2024-05-07 LAB — MICROSCOPIC EXAMINATION
Casts: NONE SEEN /LPF
RBC, Urine: NONE SEEN /HPF (ref 0–2)

## 2024-05-07 LAB — URINALYSIS, ROUTINE W REFLEX MICROSCOPIC
Bilirubin, UA: NEGATIVE
Glucose, UA: NEGATIVE
Ketones, UA: NEGATIVE
Nitrite, UA: POSITIVE — AB
RBC, UA: NEGATIVE
Specific Gravity, UA: 1.028 (ref 1.005–1.030)
Urobilinogen, Ur: 1 mg/dL (ref 0.2–1.0)
pH, UA: 5.5 (ref 5.0–7.5)

## 2024-05-08 LAB — URINE CULTURE

## 2024-05-09 ENCOUNTER — Ambulatory Visit: Payer: Self-pay | Admitting: Women's Health

## 2024-05-25 ENCOUNTER — Other Ambulatory Visit: Payer: Self-pay | Admitting: Obstetrics & Gynecology

## 2024-06-17 DIAGNOSIS — J302 Other seasonal allergic rhinitis: Secondary | ICD-10-CM | POA: Diagnosis not present

## 2024-06-17 DIAGNOSIS — Z6824 Body mass index (BMI) 24.0-24.9, adult: Secondary | ICD-10-CM | POA: Diagnosis not present

## 2024-06-17 DIAGNOSIS — Z139 Encounter for screening, unspecified: Secondary | ICD-10-CM | POA: Diagnosis not present

## 2024-06-17 DIAGNOSIS — Z90710 Acquired absence of both cervix and uterus: Secondary | ICD-10-CM | POA: Diagnosis not present

## 2024-06-17 DIAGNOSIS — F419 Anxiety disorder, unspecified: Secondary | ICD-10-CM | POA: Diagnosis not present

## 2024-06-21 ENCOUNTER — Ambulatory Visit: Admitting: Allergy

## 2024-06-21 ENCOUNTER — Telehealth: Payer: Self-pay

## 2024-06-21 ENCOUNTER — Encounter: Payer: Self-pay | Admitting: Allergy

## 2024-06-21 ENCOUNTER — Other Ambulatory Visit: Payer: Self-pay

## 2024-06-21 VITALS — BP 116/68 | HR 78 | Temp 97.9°F | Resp 18 | Ht 64.0 in | Wt 141.1 lb

## 2024-06-21 DIAGNOSIS — J3089 Other allergic rhinitis: Secondary | ICD-10-CM | POA: Diagnosis not present

## 2024-06-21 DIAGNOSIS — J4520 Mild intermittent asthma, uncomplicated: Secondary | ICD-10-CM

## 2024-06-21 DIAGNOSIS — T781XXD Other adverse food reactions, not elsewhere classified, subsequent encounter: Secondary | ICD-10-CM | POA: Diagnosis not present

## 2024-06-21 DIAGNOSIS — H1013 Acute atopic conjunctivitis, bilateral: Secondary | ICD-10-CM

## 2024-06-21 DIAGNOSIS — B999 Unspecified infectious disease: Secondary | ICD-10-CM

## 2024-06-21 DIAGNOSIS — T781XXA Other adverse food reactions, not elsewhere classified, initial encounter: Secondary | ICD-10-CM

## 2024-06-21 MED ORDER — LEVALBUTEROL TARTRATE 45 MCG/ACT IN AERO
4.0000 | INHALATION_SPRAY | Freq: Once | RESPIRATORY_TRACT | Status: AC
  Administered 2024-06-21: 4 via RESPIRATORY_TRACT

## 2024-06-21 MED ORDER — AIRSUPRA 90-80 MCG/ACT IN AERO: 2.0000 | INHALATION_SPRAY | RESPIRATORY_TRACT | 2 refills | Status: AC | PRN

## 2024-06-21 NOTE — Progress Notes (Signed)
 New Patient Note  RE: Sheri Wyatt MRN: [id number] DOB: [birth year] Date of Office Visit: 06/21/2024  Consult requested by: Marvine Rush, MD Primary care provider: Marvine Rush, MD  Chief Complaint: Establish Care (Allery's eye swelling ear itching, throat disconfort increased 3-4 months. )  History of Present Illness: I had the pleasure of seeing Sheri Wyatt for initial evaluation at the [facility] on 06/21/2024. She is a 38 y.o. female, who is self-referred here for the evaluation of allergies.  Discussed the use of AI scribe software for clinical note transcription with the patient, who gave verbal consent to proceed.    Over the past few months, her allergy symptoms have become unbearable, particularly at night and in the morning. Her eyes become swollen, red, and itchy to the point where she can barely see. She experiences constant itching in her nose, throat, and ears, and occasionally has a stuffy nose and sneezing. She has been using cetirizine in the morning and Benadryl at night, along with zaditor allergy eye drops and a fluticasone  nasal spray, which provide some relief but do not completely alleviate her symptoms.  She has a history of allergies since childhood but has not been tested since she was young. She recalls having allergy shots as a child, which her mother believed helped. She was previously tested for corn allergies, which was significant in her childhood diet, and she avoids corn and high fructose corn syrup now due to mild reactions such as throat clearing and itching.  She has a history of asthma and uses an albuterol  inhaler approximately once every three weeks, typically when she is sick. She has used steroids in the past for asthma exacerbations triggered by illness. She recalls using Combivent and possibly Advair in the past, but not recently.  She has had pneumonia multiple times as a child but not as an adult. She reports  occasional reflux during physical activity.   She has a dog for about two years, which initially did not worsen her symptoms, but she is uncertain about its current impact. No recent fevers, chills, or significant changes in appetite or bowel habits. No recent epistaxis, but experiences sores in her nose occasionally.     She reports symptoms of itchy/red/swollen eyes, itchy nose/throat, nasal congestion, sneezing. Symptoms have been going on for many years. The symptoms are present all year around with worsening the past 3-4 months. Anosmia: sometimes. Headache: sometimes. She has used cetirizine, benadryl, Flonase  1-2 sprays per nostril twice a day, Zaditor with some improvement in symptoms. Sinus infections: none. Previous work up includes: patient was on AIT as a child and unsure how long she was on it and she thinks it did help.  Previous ENT evaluation: no, no prior sinus surgery. Last eye exam: few years ago. History of reflux: sometimes with activity.   Assessment and Plan: Bula is a 38 y.o. female with: Other allergic rhinitis Allergic conjunctivitis of both eyes Perennial symptoms which worsened the past 3-4 months. Patient was on AIT as a child. 1 dog at home. Return for allergy skin testing. Will make additional recommendations based on results. If significant positives will recommend allergy injections - handout given. Use ketotifen (zaditor) eye drops 0.025% twice a day as needed for itchy/watery eyes. No eye drops for 1 day before skin testing.  Use Flonase  (fluticasone ) nasal spray 1-2 sprays per nostril once a day as needed for nasal congestion.  Nasal saline spray (i.e., Simply Saline) or nasal saline  lavage (i.e., NeilMed) is recommended as needed and prior to medicated nasal sprays.  Mild intermittent asthma without complication Usually triggered by infections. Used to be on Advair in the past.  Today's spirometry was normal with 4% and 120cc improvement in FEV1 post  bronchodilator treatment. Clinically feeling unchanged. May use Airsupra  rescue inhaler 2 puffs every 4 to 6 hours as needed for shortness of breath, chest tightness, coughing, and wheezing. Do not use more than 12 puffs in 24 hours. May use Airsupra  rescue inhaler 2 puffs 5 to 15 minutes prior to strenuous physical activities. Rinse mouth after each use.  Coupon given.  Monitor frequency of use - if you need to use it more than twice per week on a consistent basis let us  know.  If not covered then use: May use albuterol  rescue inhaler 2 puffs or nebulizer every 4 to 6 hours as needed for shortness of breath, chest tightness, coughing, and wheezing. May use albuterol  rescue inhaler 2 puffs 5 to 15 minutes prior to strenuous physical activities. Monitor frequency of use - if you need to use it more than twice per week on a consistent basis let us  know.   Other adverse food reactions, not elsewhere classified, initial encounter Patient had testing as a child which was positive to corn and there was a concern if it was contributing to her infections/asthma/allergies at that time. Currently avoiding corn and limiting corn containing products.  Continue to avoid. Will recheck at next visit.  For mild symptoms you can take over the counter antihistamines (zyrtec 10mg  to 20mg ) and monitor symptoms closely.  If symptoms worsen or if you have severe symptoms including breathing issues, throat closure, significant swelling, whole body hives, severe diarrhea and vomiting, lightheadedness then seek immediate medical care.   Recurrent infections History of pneumonias in the past. Usually requires 2-3 courses of antibiotics per year.  Keep track of infections and antibiotics use. If persistent will get bloodwork next to look at immune system.   Return for Skin testing. 1-55 and corn  Meds ordered this encounter  Medications   levalbuterol  (XOPENEX  HFA) inhaler 4 puff   Albuterol -Budesonide (AIRSUPRA )  90-80 MCG/ACT AERO    Sig: Inhale 2 puffs into the lungs every 4 (four) hours as needed (coughing, wheezing, chest tightness). Do not exceed 12 puffs in 24 hours.    Dispense:  10.7 g    Refill:  2    BIN: [id number], PCN: [id number], GRP: [id number], ID [id number]   Lab Orders  No laboratory test(s) ordered today    Other allergy screening: Asthma: yes Rare albuterol  use - once every 3 weeks. Usually worse with infections.    Food allergy: limiting corn products  Medication allergy: no Hymenoptera allergy: no Urticaria: no As a teen. Eczema:no History of recurrent infections suggestive of immunodeficency: 2-3 courses of antibiotics per year.  Diagnostics: Spirometry:  Tracings reviewed. Her effort: Good reproducible efforts. FVC: 3.25L FEV1: 2.50L, 82% predicted FEV1/FVC ratio: 77% Interpretation: Spirometry consistent with normal pattern with 4% and 120cc improvement in FEV1 post bronchodilator treatment. Clinically feeling unchanged.   Please see scanned spirometry results for details.  Results discussed with patient/family.   Past Medical History: There are no active problems to display for this patient.  Past Medical History:  Diagnosis Date   Anxiety    Asthma    Interstitial cystitis    Melanoma (HCC)    Melanoma (HCC)    Past Surgical History: Past Surgical History:  Procedure Laterality Date  NORPLANT  REMOVAL  04/28/2012   Procedure: REMOVAL OF NORPLANT ;  Surgeon: Vonn VEAR Inch, MD;  Location: [facility];  Service: Gynecology;  Laterality: N/A;  removal of implanon-started at 1604   TONGUE SURGERY     as child   VAGINAL HYSTERECTOMY  04/28/2012   Procedure: HYSTERECTOMY VAGINAL;  Surgeon: Vonn VEAR Inch, MD;  Location: [facility];  Service: Gynecology;  Laterality: N/A;   Medication List:  Current Outpatient Medications  Medication Sig Dispense Refill   albuterol  (PROVENTIL ) (2.5 MG/3ML) 0.083% nebulizer solution Take 3 mLs (2.5 mg total) by nebulization every 6 (six)  hours as needed for wheezing or shortness of breath. 75 mL 0   albuterol  (VENTOLIN  HFA) 108 (90 Base) MCG/ACT inhaler Inhale 2 puffs into the lungs every 6 (six) hours as needed for wheezing or shortness of breath. 8 g 0   Albuterol -Budesonide (AIRSUPRA ) 90-80 MCG/ACT AERO Inhale 2 puffs into the lungs every 4 (four) hours as needed (coughing, wheezing, chest tightness). Do not exceed 12 puffs in 24 hours. 10.7 g 2   cetirizine (ZYRTEC) 10 MG chewable tablet Chew 10 mg by mouth daily.     diphenhydrAMINE HCl (BENADRYL ALLERGY PO) Take by mouth.     fluticasone  (FLONASE ) 50 MCG/ACT nasal spray Place into both nostrils daily.     ondansetron  (ZOFRAN ) 4 MG tablet Take 1 tablet (4 mg total) by mouth every 8 (eight) hours as needed for nausea or vomiting. 30 tablet 0   PARoxetine  (PAXIL ) 40 MG tablet TAKE (1) TABLET BY MOUTH ONCE DAILY. 30 tablet 11   pentosan polysulfate (ELMIRON ) 100 MG capsule Take 2 capsules (200 mg total) by mouth 2 (two) times daily. 120 capsule 3   phenazopyridine  (PYRIDIUM ) 200 MG tablet Take 1 tablet (200 mg total) by mouth 3 (three) times daily as needed for pain. 60 tablet 1   promethazine -dextromethorphan (PROMETHAZINE -DM) 6.25-15 MG/5ML syrup Take 5 mLs by mouth 4 (four) times daily as needed. 100 mL 0   traZODone  (DESYREL ) 50 MG tablet Take 1 tablet (50 mg total) by mouth at bedtime. 30 tablet 1   valACYclovir (VALTREX) 500 MG tablet Take 500 tablets by mouth daily as needed.     hydrocortisone  (ANUSOL -HC) 25 MG suppository Place 1 suppository (25 mg total) rectally 2 (two) times daily. For seven days 14 suppository 1   No current facility-administered medications for this visit.   Allergies: Allergies  Allergen Reactions   Corn-Containing Products Hives and Itching    REACTION: Sneezing   Social History: Social History   Socioeconomic History   Marital status: Married    Spouse name: Not on file   Number of children: Not on file   Years of education: Not on  file   Highest education level: Not on file  Occupational History   Not on file  Tobacco Use   Smoking status: Former    Current packs/day: 0.00    Average packs/day: 0.3 packs/day for 1.5 years (0.4 ttl pk-yrs)    Types: Cigarettes    Start date: 10/21/2001    Quit date: 04/22/2003    Years since quitting: 21.1    Passive exposure: Current   Smokeless tobacco: Never   Tobacco comments:    Exposed to vape smoke at home  Vaping Use   Vaping status: Never Used  Substance and Sexual Activity   Alcohol use: Yes    Alcohol/week: 0.0 standard drinks of alcohol    Comment: occassional   Drug use: No    Comment:  cocoaine- and ectasy 8 yrs ago   Sexual activity: Yes    Birth control/protection: Surgical    Comment: hyst  Other Topics Concern   Not on file  Social History Narrative   06/21/2024 patient has a dog and 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 Smoking: denies Occupation: Designer, fashion/clothing History: Water  Damage/mildew in the house: no Carpet in the family room: no Carpet in the bedroom: yes Heating: electric Cooling: central Pet: yes 1 dog x 2 years  Family History: Family History  Problem Relation Age of Onset   Asthma Mother    Colon polyps Mother    Cancer Mother        melanoma   Hypertension Mother    Heart disease Maternal Grandfather    Seizures Maternal Grandfather        mini   Cancer Paternal Grandfather        prostate    Asthma Daughter    Stomach cancer Other    Rectal cancer Other    Esophageal cancer Other    Colon cancer Other    Review of Systems  Constitutional:  Negative for appetite change, chills, fever and unexpected weight change.  HENT:  Positive for congestion  and sneezing.   Eyes:  Positive for itching.  Respiratory:  Negative for cough, chest tightness, shortness of breath and wheezing.   Cardiovascular:  Negative for chest pain.  Gastrointestinal:  Negative for abdominal pain.  Genitourinary:  Negative for difficulty urinating.  Skin:  Negative for rash.  Allergic/Immunologic: Positive for environmental allergies.  Neurological:  Positive for headaches.    Objective: BP 116/68   Pulse 78   Temp 97.9 F (36.6 C) (Temporal)   Resp 18   Ht 5' 4 (1.626 m)   Wt 141 lb 1.9 oz (64 kg)   LMP 04/18/2012   SpO2 97%   BMI 24.22 kg/m  Body mass index is 24.22 kg/m. Physical Exam Vitals and nursing note reviewed.  Constitutional:      Appearance: Normal appearance. She is well-developed.  HENT:     Head: Normocephalic and atraumatic.     Right Ear: Tympanic membrane and external ear normal.     Left Ear: Tympanic membrane and external ear normal.     Nose: Nose normal.     Mouth/Throat:     Mouth: Mucous membranes are moist.     Pharynx: Oropharynx is clear.  Eyes:     Conjunctiva/sclera: Conjunctivae normal.  Cardiovascular:     Rate and Rhythm: Normal rate and regular rhythm.     Heart sounds: Normal heart sounds. No murmur heard.    No friction rub. No gallop.  Pulmonary:     Effort: Pulmonary effort is normal.     Breath sounds: Normal breath sounds. No wheezing, rhonchi or rales.  Musculoskeletal:     Cervical back: Neck supple.  Skin:    General: Skin is warm.     Findings: No rash.  Neurological:     Mental Status: She is alert and oriented to person, place, and time.  Psychiatric:        Behavior: Behavior normal.    The plan was reviewed with the  patient/family, and all questions/concerned were addressed.  It was my pleasure to see Donald today and participate in her care. Please feel free to contact me with any questions or concerns.  Sincerely,  Orlan Cramp, DO Allergy & Immunology  [facility]  [place] office: [phone] [facility] office: [phone]

## 2024-06-21 NOTE — Patient Instructions (Addendum)
 Rhinitis  Return for allergy skin testing. Will make additional recommendations based on results. If significant positives will recommend allergy injections - handout given. Make sure you don't take any antihistamines for 3 days before the skin testing appointment. Don't put any lotion on the back and arms on the day of testing.  Must be in good health and not ill. No vaccines/injections/antibiotics within the past 7 days.  Plan on being here for 30-60 minutes.  Use ketotifen (zaditor) eye drops 0.025% twice a day as needed for itchy/watery eyes. No eye drops for 1 day before skin testing.  Use Flonase  (fluticasone ) nasal spray 1-2 sprays per nostril once a day as needed for nasal congestion.  Nasal saline spray (i.e., Simply Saline) or nasal saline lavage (i.e., NeilMed) is recommended as needed and prior to medicated nasal sprays.  Breathing May use Airsupra  rescue inhaler 2 puffs every 4 to 6 hours as needed for shortness of breath, chest tightness, coughing, and wheezing. Do not use more than 12 puffs in 24 hours. May use Airsupra  rescue inhaler 2 puffs 5 to 15 minutes prior to strenuous physical activities. Rinse mouth after each use.  Coupon given.  Monitor frequency of use - if you need to use it more than twice per week on a consistent basis let us  know.   If not covered then use: May use albuterol  rescue inhaler 2 puffs or nebulizer every 4 to 6 hours as needed for shortness of breath, chest tightness, coughing, and wheezing. May use albuterol  rescue inhaler 2 puffs 5 to 15 minutes prior to strenuous physical activities. Monitor frequency of use - if you need to use it more than twice per week on a consistent basis let us  know.   Corn Continue to avoid. Will recheck at next visit.  For mild symptoms you can take over the counter antihistamines (zyrtec 10mg  to 20mg ) and monitor symptoms closely.  If symptoms worsen or if you have severe symptoms including breathing issues, throat  closure, significant swelling, whole body hives, severe diarrhea and vomiting, lightheadedness then seek immediate medical care.   Infections Keep track of infections and antibiotics use. If persistent will get bloodwork next to look at immune system.   Follow up for skin testing.    Recommend eye exam.

## 2024-06-21 NOTE — Telephone Encounter (Signed)
*  AA  Pharmacy Patient Advocate Encounter   Received notification from CoverMyMeds that prior authorization for Airsupra  90-80MCG/ACT aerosol is required/requested.   Insurance verification completed.   The patient is insured through [facility] .   Per test claim: PA required; PA started via CoverMyMeds. KEY [id number] . Please see clinical question(s) below that I am not finding the answer to in their chart and advise.   Has the patient tried any other medications for this diagnosis?

## 2024-06-23 NOTE — Telephone Encounter (Signed)
 Your PA request has been approved. Additional information will be provided in the approval communication. (Message 1145) Authorization Expiration09/01/2025

## 2024-06-24 NOTE — Telephone Encounter (Signed)
 I called the patient and left a message to call the office back to inform of Airsupra  approval.

## 2024-06-27 ENCOUNTER — Ambulatory Visit: Admitting: Internal Medicine

## 2024-06-27 NOTE — Telephone Encounter (Signed)
 I called the patient and left a vm to call the office back. While documenting the patient called back and I informed of airsupra  approval. She also requested to move her allergy testing appt and it has been moved.

## 2024-06-28 ENCOUNTER — Ambulatory Visit: Admitting: Allergy

## 2024-06-30 ENCOUNTER — Ambulatory Visit: Admitting: Allergy

## 2024-06-30 ENCOUNTER — Encounter: Payer: Self-pay | Admitting: Allergy

## 2024-06-30 DIAGNOSIS — J3089 Other allergic rhinitis: Secondary | ICD-10-CM | POA: Diagnosis not present

## 2024-06-30 DIAGNOSIS — T781XXA Other adverse food reactions, not elsewhere classified, initial encounter: Secondary | ICD-10-CM

## 2024-06-30 DIAGNOSIS — J4520 Mild intermittent asthma, uncomplicated: Secondary | ICD-10-CM

## 2024-06-30 DIAGNOSIS — H1013 Acute atopic conjunctivitis, bilateral: Secondary | ICD-10-CM

## 2024-06-30 DIAGNOSIS — T781XXD Other adverse food reactions, not elsewhere classified, subsequent encounter: Secondary | ICD-10-CM

## 2024-06-30 DIAGNOSIS — B999 Unspecified infectious disease: Secondary | ICD-10-CM

## 2024-06-30 MED ORDER — RYALTRIS 665-25 MCG/ACT NA SUSP: 1.0000 | Freq: Two times a day (BID) | NASAL | 5 refills | Status: AC

## 2024-06-30 NOTE — Progress Notes (Signed)
 Skin testing note  RE: Sheri Wyatt MRN: [id number] DOB: Sep 24, 1986 Date of Office Visit: 06/30/2024  Referring provider: Marvine Rush, MD Primary care provider: Toribio Jerel MATSU, MD  Chief Complaint: skin testing  History of Present Illness: I had the pleasure of seeing Sheri Wyatt for a skin testing visit at the [facility] on 06/30/2024. She is a 38 y.o. female, who is being followed for allergic rhinoconjunctivitis, asthma, adverse food reaction and recurrent infections. Her previous allergy office visit was on 06/21/2024 with Sheri Wyatt. Today is a skin testing visit.  She is accompanied today by her husband who provided/contributed to the history.   Discussed the use of AI scribe software for clinical note transcription with the patient, who gave verbal consent to proceed.    She has multiple environmental allergies, including grass, trees, ragweed, weed, mold, dust mites, cat, dog, feathers, tobacco, horse, and cockroach. Corn allergy is borderline.   She has a dog at home. She has been avoiding corn in her diet where possible, acknowledging that 'everything has corn in it'.     Assessment and Plan: Sheri Wyatt is a 38 y.o. female with: Other allergic rhinitis Allergic conjunctivitis of both eyes Past history - perennial symptoms which worsened the past 3-4 months. Patient was on AIT as a child. 1 dog at home. Interim history - very symptomatic while off antihistamines for today's skin testing appointment.  Today's skin testing positive to grass, weed, ragweed, trees, mold, dust mites, cat, dog, feathers, tobacco. Borderline to horse, cockroach.  Start environmental control measures as below. Use over the counter antihistamines such as Zyrtec (cetirizine), Claritin (loratadine), Allegra (fexofenadine), or Xyzal (levocetirizine) daily as needed. May take twice a day during allergy flares. May switch antihistamines every few months. Use ketotifen (Zaditor) eye  drops 0.025% twice a day as needed for itchy/watery eyes. Start Ryaltris  (olopatadine + mometasone nasal spray combination) 1-2 sprays per nostril twice a day. Sample given. This replaces your other nasal sprays. If this works well for you, then have pharmacy ship the medication to your home - prescription already sent in.  Nasal saline spray (i.e., Simply Saline) or nasal saline lavage (i.e., NeilMed) is recommended as needed and prior to medicated nasal sprays. Recommend allergy injections. 2 injections. Let us  know when ready to start.  Had a detailed discussion with patient/family that clinical history is suggestive of allergic rhinitis, and may benefit from allergy immunotherapy (AIT). Discussed in detail regarding the dosing, schedule, side effects (mild to moderate local allergic reaction and rarely systemic allergic reactions including anaphylaxis), and benefits (significant improvement in nasal symptoms, seasonal flares of asthma) of immunotherapy with the patient. There is significant time commitment involved with allergy shots, which includes weekly immunotherapy injections for first 9-12 months and then biweekly to monthly injections for 3-5 years. Consent was signed. Prefers [facility] office.  Recommend eye exam.   Mild intermittent asthma without complication Past history - Usually triggered by infections. Used to be on Advair in the past. 2025 spirometry was normal with 4% and 120cc improvement in FEV1 post bronchodilator treatment. Clinically feeling unchanged. May use Airsupra  rescue inhaler 2 puffs every 4 to 6 hours as needed for shortness of breath, chest tightness, coughing, and wheezing. Do not use more than 12 puffs in 24 hours. May use Airsupra  rescue inhaler 2 puffs 5 to 15 minutes prior to strenuous physical activities. Rinse mouth after each use.  Monitor frequency of use - if you need to use  it more than twice per week on a consistent basis let us  know.    Other adverse  food reactions, not elsewhere classified, initial encounter Past history - patient had testing as a child which was positive to corn and there was a concern if it was contributing to her infections/asthma/allergies at that time. Currently avoiding corn and limiting corn containing products.  Today's skin testing borderline positive to corn. Continue to avoid. For mild symptoms you can take over the counter antihistamines (zyrtec 10mg  to 20mg ) and monitor symptoms closely.  If symptoms worsen or if you have severe symptoms including breathing issues, throat closure, significant swelling, whole body hives, severe diarrhea and vomiting, lightheadedness then seek immediate medical care.  Get bloodwork.  Recurrent infections Past history - of pneumonias in the past. Usually requires 2-3 courses of antibiotics per year.  Keep track of infections and antibiotics use. Get bloodwork to look at immune system.   Return in about 6 months (around 12/28/2024).  Meds ordered this encounter  Medications   Olopatadine-Mometasone (RYALTRIS ) 665-25 MCG/ACT SUSP    Sig: Place 1-2 sprays into the nose in the morning and at bedtime.    Dispense:  29 g    Refill:  5   Lab Orders         CBC with Differential/Platelet         IgG, IgA, IgM         Strep pneumoniae 23 Serotypes IgG         Diphtheria / Tetanus Antibody Panel         Corn IgE      Diagnostics: Skin Testing: Environmental allergy panel and select foods. Today's skin testing positive to grass, weed, ragweed, trees, mold, dust mites, cat, dog, feathers, tobacco. Borderline to horse, cockroach.  Borderline to corn.  Results discussed with patient/family.  Airborne Adult Perc - 06/30/24 1349     Time Antigen Placed 1349    Allergen Manufacturer Jestine    Location Back    Number of Test 55    1. Control-Buffer 50% Glycerol Negative    2. Control-Histamine 4+    3. [place]ia 2+    4. [place] 2+    5. Johnson 2+    6. [place]  Blue 4+    7.  Meadow Fescue 4+    8. Perennial Rye 4+    9. Timothy 4+    10. Ragweed Mix 2+    11. Cocklebur Negative    12. Plantain,  English 2+    13. Baccharis Negative    14. Dog Fennel Negative    15. Guernsey Thistle 2+    16. Lamb's Quarters 2+    17. Sheep Sorrell 2+    18. Rough Pigweed 2+    19. Marsh Elder, Rough Negative    20. Mugwort, Common Negative    21. Box, Elder Negative    22. Cedar, red Negative    23. Sweet Gum Negative    24. Pecan Pollen 3+    25. Pine Mix 2+    26. Walnut, Black Pollen 2+    27. Red Mulberry Negative    28. Ash Mix Negative    29. Birch Mix Negative    30. Beech American Negative    31. Cottonwood, [place] Negative    32. Hickory, White 4+    33. Maple Mix 2+    34. Oak, [place] Mix 4+    35. Sycamore Eastern Negative    36. Alternaria Alternata 2+  37. Cladosporium Herbarum 2+    38. Aspergillus Mix 2+    39. Penicillium Mix 2+    40. Bipolaris Sorokiniana (Helminthosporium) 2+    41. Drechslera Spicifera (Curvularia) 2+    42. Mucor Plumbeus 2+    43. Fusarium Moniliforme 4+    44. Aureobasidium Pullulans (pullulara) Negative    45. Rhizopus Oryzae Negative    46. Botrytis Cinera 2+    47. Epicoccum Nigrum 2+    48. Phoma Betae 2+    49. Dust Mite Mix 2+    50. Cat Hair 10,000 BAU/ml 4+    51.  Dog Epithelia 2+    52. Mixed Feathers 2+    53. Horse Epithelia --   +/-   54. Cockroach, [place] --   +/-   55. Tobacco Leaf 2+          Food Adult Perc - 06/30/24 1300     Time Antigen Placed 1349    Allergen Manufacturer Jestine    Location Back    Number of allergen test 1    52. Corn --   +/-         Previous notes and tests were reviewed. The plan was reviewed with the patient/family, and all questions/concerned were addressed.  It was my pleasure to see Cailyn today and participate in her care. Please feel free to contact me with any questions or concerns.  Sincerely,  Orlan Cramp, DO Allergy & Immunology  [facility]  [facility] office: [phone] [facility] office: [phone]

## 2024-06-30 NOTE — Patient Instructions (Addendum)
 Today's skin testing positive to grass, weed, ragweed, trees, mold, dust mites, cat, dog, feathers, tobacco. Borderline to horse, cockroach.  Borderline to corn.   Results given.  Environmental allergies Start environmental control measures as below. Use over the counter antihistamines such as Zyrtec (cetirizine), Claritin (loratadine), Allegra (fexofenadine), or Xyzal (levocetirizine) daily as needed. May take twice a day during allergy flares. May switch antihistamines every few months. Use ketotifen (zaditor) eye drops 0.025% twice a day as needed for itchy/watery eyes. Start Ryaltris  (olopatadine + mometasone nasal spray combination) 1-2 sprays per nostril twice a day. Sample given. This replaces your other nasal sprays. If this works well for you, then have pharmacy ship the medication to your home - prescription already sent in.  Nasal saline spray (i.e., Simply Saline) or nasal saline lavage (i.e., NeilMed) is recommended as needed and prior to medicated nasal sprays. Recommend allergy injections. 2 injections. Let us  know when ready to start.  Had a detailed discussion with patient/family that clinical history is suggestive of allergic rhinitis, and may benefit from allergy immunotherapy (AIT). Discussed in detail regarding the dosing, schedule, side effects (mild to moderate local allergic reaction and rarely systemic allergic reactions including anaphylaxis), and benefits (significant improvement in nasal symptoms, seasonal flares of asthma) of immunotherapy with the patient. There is significant time commitment involved with allergy shots, which includes weekly immunotherapy injections for first 9-12 months and then biweekly to monthly injections for 3-5 years. Consent was signed. Prefers [facility] office.   Breathing May use Airsupra  rescue inhaler 2 puffs every 4 to 6 hours as needed for shortness of breath, chest tightness, coughing, and wheezing. Do not use more than 12 puffs in  24 hours. May use Airsupra  rescue inhaler 2 puffs 5 to 15 minutes prior to strenuous physical activities. Rinse mouth after each use.  Monitor frequency of use - if you need to use it more than twice per week on a consistent basis let us  know.   Corn Continue to avoid. For mild symptoms you can take over the counter antihistamines (zyrtec 10mg  to 20mg ) and monitor symptoms closely.  If symptoms worsen or if you have severe symptoms including breathing issues, throat closure, significant swelling, whole body hives, severe diarrhea and vomiting, lightheadedness then seek immediate medical care.  Get bloodwork We are ordering labs, so please allow 1-2 weeks for the results to come back. With the newly implemented Cures Act, the labs might be visible to you at the same time that they become visible to me. However, I will not address the results until all of the results are back, so please be patient.  In the meantime, continue recommendations in your patient instructions, including avoidance measures (if applicable), until you hear from me.  Infections Keep track of infections and antibiotics use. Get bloodwork to look at immune system.   Recommend eye exam.  Return in about 6 months (around 12/28/2024). Or sooner if needed.   Reducing Pollen Exposure Pollen seasons: trees (spring), grass (summer) and ragweed/weeds (fall). Keep windows closed in your home and car to lower pollen exposure.  Install air conditioning in the bedroom and throughout the house if possible.  Avoid going out in dry windy days - especially early morning. Pollen counts are highest between 5 - 10 AM and on dry, hot and windy days.  Save outside activities for late afternoon or after a heavy rain, when pollen levels are lower.  Avoid mowing of grass if you have grass pollen allergy. Be aware  that pollen can also be transported indoors on people and pets.  Dry your clothes in an automatic dryer rather than hanging them  outside where they might collect pollen.  Rinse hair and eyes before bedtime. Mold Control Mold and fungi can grow on a variety of surfaces provided certain temperature and moisture conditions exist.  Outdoor molds grow on plants, decaying vegetation and soil. The major outdoor mold, Alternaria and Cladosporium, are found in very high numbers during hot and dry conditions. Generally, a late summer - fall peak is seen for common outdoor fungal spores. Rain will temporarily lower outdoor mold spore count, but counts rise rapidly when the rainy period ends. The most important indoor molds are Aspergillus and Penicillium. Dark, humid and poorly ventilated basements are ideal sites for mold growth. The next most common sites of mold growth are the bathroom and the kitchen. Outdoor (Seasonal) Mold Control Use air conditioning and keep windows closed. Avoid exposure to decaying vegetation. Avoid leaf raking. Avoid grain handling. Consider wearing a face mask if working in moldy areas.  Indoor (Perennial) Mold Control  Maintain humidity below 50%. Get rid of mold growth on hard surfaces with water , detergent and, if necessary, 5% bleach (do not mix with other cleaners). Then dry the area completely. If mold covers an area more than 10 square feet, consider hiring an indoor environmental professional. For clothing, washing with soap and water  is best. If moldy items cannot be cleaned and dried, throw them away. Remove sources e.g. contaminated carpets. Repair and seal leaking roofs or pipes. Using dehumidifiers in damp basements may be helpful, but empty the water  and clean units regularly to prevent mildew from forming. All rooms, especially basements, bathrooms and kitchens, require ventilation and cleaning to deter mold and mildew growth. Avoid carpeting on concrete or damp floors, and storing items in damp areas. Control of House Dust Mite Allergen Dust mite allergens are a common trigger of allergy  and asthma symptoms. While they can be found throughout the house, these microscopic creatures thrive in warm, humid environments such as bedding, upholstered furniture and carpeting. Because so much time is spent in the bedroom, it is essential to reduce mite levels there.  Encase pillows, mattresses, and box springs in special allergen-proof fabric covers or airtight, zippered plastic covers.  Bedding should be washed weekly in hot water  (130 F) and dried in a hot dryer. Allergen-proof covers are available for comforters and pillows that can't be regularly washed.  Wash the allergy-proof covers every few months. Minimize clutter in the bedroom. Keep pets out of the bedroom.  Keep humidity less than 50% by using a dehumidifier or air conditioning. You can buy a humidity measuring device called a hygrometer to monitor this.  If possible, replace carpets with hardwood, linoleum, or washable area rugs. If that's not possible, vacuum frequently with a vacuum that has a HEPA filter. Remove all upholstered furniture and non-washable window drapes from the bedroom. Remove all non-washable stuffed toys from the bedroom.  Wash stuffed toys weekly. Pet Allergen Avoidance: Contrary to popular opinion, there are no "hypoallergenic" breeds of dogs or cats. That is because people are not allergic to an animal's hair, but to an allergen found in the animal's saliva, dander (dead skin flakes) or urine. Pet allergy symptoms typically occur within minutes. For some people, symptoms can build up and become most severe 8 to 12 hours after contact with the animal. People with severe allergies can experience reactions in public places if dander  has been transported on the pet owners' clothing. Keeping an animal outdoors is only a partial solution, since homes with pets in the yard still have higher concentrations of animal allergens. Before getting a pet, ask your allergist to determine if you are allergic to animals. If  your pet is already considered part of your family, try to minimize contact and keep the pet out of the bedroom and other rooms where you spend a great deal of time. As with dust mites, vacuum carpets often or replace carpet with a hardwood floor, tile or linoleum. High-efficiency particulate air (HEPA) cleaners can reduce allergen levels over time. While dander and saliva are the source of cat and dog allergens, urine is the source of allergens from rabbits, hamsters, mice and [place] pigs; so ask a non-allergic family member to clean the animal's cage. If you have a pet allergy, talk to your allergist about the potential for allergy immunotherapy (allergy shots). This strategy can often provide long-term relief. Cockroach Allergen Avoidance Cockroaches are often found in the homes of densely populated urban areas, schools or commercial buildings, but these creatures can lurk almost anywhere. This does not mean that you have a dirty house or living area. Block all areas where roaches can enter the home. This includes crevices, wall cracks and windows.  Cockroaches need water  to survive, so fix and seal all leaky faucets and pipes. Have an exterminator go through the house when your family and pets are gone to eliminate any remaining roaches. Keep food in lidded containers and put pet food dishes away after your pets are done eating. Vacuum and sweep the floor after meals, and take out garbage and recyclables. Use lidded garbage containers in the kitchen. Wash dishes immediately after use and clean under stoves, refrigerators or toasters where crumbs can accumulate. Wipe off the stove and other kitchen surfaces and cupboards regularly.

## 2024-07-29 LAB — STREP PNEUMONIAE 23 SEROTYPES IGG
Pneumo Ab Type 1*: 0.6 ug/mL — ABNORMAL LOW (ref >1.3)
Pneumo Ab Type 12 (12F)*: <0.1 ug/mL — ABNORMAL LOW (ref >1.3)
Pneumo Ab Type 14*: <0.1 ug/mL — ABNORMAL LOW (ref >1.3)
Pneumo Ab Type 17 (17F)*: 0.6 ug/mL — ABNORMAL LOW (ref >1.3)
Pneumo Ab Type 19 (19F)*: 0.2 ug/mL — ABNORMAL LOW (ref >1.3)
Pneumo Ab Type 2*: <0.2 ug/mL — ABNORMAL LOW (ref >1.3)
Pneumo Ab Type 20*: 3.5 ug/mL (ref >1.3)
Pneumo Ab Type 22 (22F)*: <0.1 ug/mL — ABNORMAL LOW (ref >1.3)
Pneumo Ab Type 23 (23F)*: <0.1 ug/mL — ABNORMAL LOW (ref >1.3)
Pneumo Ab Type 26 (6B)*: <0.1 ug/mL — ABNORMAL LOW (ref >1.3)
Pneumo Ab Type 3*: 0.1 ug/mL — ABNORMAL LOW (ref >1.3)
Pneumo Ab Type 34 (10A)*: 0.8 ug/mL — ABNORMAL LOW (ref >1.3)
Pneumo Ab Type 4*: <0.1 ug/mL — ABNORMAL LOW (ref >1.3)
Pneumo Ab Type 43 (11A)*: 0.2 ug/mL — ABNORMAL LOW (ref >1.3)
Pneumo Ab Type 5*: <0.1 ug/mL — ABNORMAL LOW (ref >1.3)
Pneumo Ab Type 51 (7F)*: <0.1 ug/mL — ABNORMAL LOW (ref >1.3)
Pneumo Ab Type 54 (15B)*: <0.2 ug/mL — ABNORMAL LOW (ref >1.3)
Pneumo Ab Type 56 (18C)*: <0.1 ug/mL — ABNORMAL LOW (ref >1.3)
Pneumo Ab Type 57 (19A)*: 0.7 ug/mL — ABNORMAL LOW (ref >1.3)
Pneumo Ab Type 68 (9V)*: <0.1 ug/mL — ABNORMAL LOW (ref >1.3)
Pneumo Ab Type 70 (33F)*: 0.5 ug/mL — ABNORMAL LOW (ref >1.3)
Pneumo Ab Type 8*: <0.3 ug/mL — ABNORMAL LOW (ref >1.3)
Pneumo Ab Type 9 (9N)*: <0.1 ug/mL — ABNORMAL LOW (ref >1.3)

## 2024-07-29 LAB — CBC WITH DIFFERENTIAL/PLATELET
Basophils Absolute: 0 x10E3/uL (ref 0.0–0.2)
Basos: 1 %
EOS (ABSOLUTE): 0.4 x10E3/uL (ref 0.0–0.4)
Eos: 7 %
Hematocrit: 41.1 % (ref 34.0–46.6)
Hemoglobin: 13 g/dL (ref 11.1–15.9)
Immature Grans (Abs): 0 x10E3/uL (ref 0.0–0.1)
Immature Granulocytes: 0 %
Lymphocytes Absolute: 1.8 x10E3/uL (ref 0.7–3.1)
Lymphs: 33 %
MCH: 29.1 pg (ref 26.6–33.0)
MCHC: 31.6 g/dL (ref 31.5–35.7)
MCV: 92 fL (ref 79–97)
Monocytes Absolute: 0.4 x10E3/uL (ref 0.1–0.9)
Monocytes: 8 %
Neutrophils Absolute: 2.8 x10E3/uL (ref 1.4–7.0)
Neutrophils: 51 %
Platelets: 247 x10E3/uL (ref 150–450)
RBC: 4.46 x10E6/uL (ref 3.77–5.28)
RDW: 12.4 % (ref 11.7–15.4)
WBC: 5.4 x10E3/uL (ref 3.4–10.8)

## 2024-07-29 LAB — DIPHTHERIA / TETANUS ANTIBODY PANEL
Diphtheria Ab: <0.1 [IU]/mL — ABNORMAL LOW (ref <0.10)
Tetanus Ab, IgG: 0.52 [IU]/mL (ref <0.10)

## 2024-07-29 LAB — IGG, IGA, IGM
IgA/Immunoglobulin A, Serum: 131 mg/dL (ref 87–352)
IgG (Immunoglobin G), Serum: 895 mg/dL (ref 586–1602)
IgM (Immunoglobulin M), Srm: 177 mg/dL (ref 26–217)

## 2024-07-29 LAB — ALLERGEN, CORN F8: Allergen Corn, IgE: <0.1 kU/L

## 2024-07-31 ENCOUNTER — Ambulatory Visit: Payer: Self-pay | Admitting: Allergy

## 2024-12-29 ENCOUNTER — Ambulatory Visit: Admitting: Allergy

## 2025-01-06 ENCOUNTER — Ambulatory Visit: Admitting: Family Medicine
